# Patient Record
Sex: Female | Born: 1970 | Race: White | Hispanic: No | Smoking: Never smoker
Health system: Southern US, Community
[De-identification: ages and names within clinical notes are randomized; demographics above are authoritative.]

## PROBLEM LIST (undated history)

## (undated) DIAGNOSIS — M810 Age-related osteoporosis without current pathological fracture: Secondary | ICD-10-CM

## (undated) DIAGNOSIS — I499 Cardiac arrhythmia, unspecified: Secondary | ICD-10-CM

## (undated) DIAGNOSIS — K589 Irritable bowel syndrome without diarrhea: Secondary | ICD-10-CM

## (undated) DIAGNOSIS — G43909 Migraine, unspecified, not intractable, without status migrainosus: Secondary | ICD-10-CM

## (undated) DIAGNOSIS — E039 Hypothyroidism, unspecified: Secondary | ICD-10-CM

## (undated) DIAGNOSIS — M1990 Unspecified osteoarthritis, unspecified site: Secondary | ICD-10-CM

## (undated) DIAGNOSIS — T7840XA Allergy, unspecified, initial encounter: Secondary | ICD-10-CM

## (undated) DIAGNOSIS — R413 Other amnesia: Secondary | ICD-10-CM

## (undated) DIAGNOSIS — D332 Benign neoplasm of brain, unspecified: Secondary | ICD-10-CM

## (undated) DIAGNOSIS — I10 Essential (primary) hypertension: Secondary | ICD-10-CM

## (undated) DIAGNOSIS — R011 Cardiac murmur, unspecified: Secondary | ICD-10-CM

## (undated) DIAGNOSIS — K219 Gastro-esophageal reflux disease without esophagitis: Secondary | ICD-10-CM

## (undated) HISTORY — DX: Age-related osteoporosis without current pathological fracture: M81.0

## (undated) HISTORY — DX: Allergy, unspecified, initial encounter: T78.40XA

## (undated) HISTORY — PX: WISDOM TOOTH EXTRACTION: SHX21

## (undated) HISTORY — DX: Irritable bowel syndrome, unspecified: K58.9

## (undated) HISTORY — DX: Gastro-esophageal reflux disease without esophagitis: K21.9

## (undated) HISTORY — DX: Benign neoplasm of brain, unspecified: D33.2

## (undated) HISTORY — PX: LIVER BIOPSY: SHX301

## (undated) HISTORY — DX: Cardiac arrhythmia, unspecified: I49.9

## (undated) HISTORY — DX: Migraine, unspecified, not intractable, without status migrainosus: G43.909

## (undated) HISTORY — DX: Other amnesia: R41.3

## (undated) HISTORY — PX: BRAIN SURGERY: SHX531

## (undated) HISTORY — DX: Hypothyroidism, unspecified: E03.9

## (undated) HISTORY — DX: Essential (primary) hypertension: I10

## (undated) HISTORY — DX: Unspecified osteoarthritis, unspecified site: M19.90

## (undated) HISTORY — DX: Cardiac murmur, unspecified: R01.1

---

## 2002-06-12 ENCOUNTER — Encounter: Payer: Self-pay | Admitting: Endocrinology

## 2002-06-12 ENCOUNTER — Ambulatory Visit (HOSPITAL_COMMUNITY): Admission: RE | Admit: 2002-06-12 | Discharge: 2002-06-12 | Payer: Self-pay | Admitting: Endocrinology

## 2003-08-22 ENCOUNTER — Other Ambulatory Visit: Admission: RE | Admit: 2003-08-22 | Discharge: 2003-08-22 | Payer: Self-pay | Admitting: Endocrinology

## 2004-01-15 HISTORY — PX: OTHER SURGICAL HISTORY: SHX169

## 2004-09-13 ENCOUNTER — Other Ambulatory Visit: Admission: RE | Admit: 2004-09-13 | Discharge: 2004-09-13 | Payer: Self-pay | Admitting: Internal Medicine

## 2004-10-26 ENCOUNTER — Encounter (INDEPENDENT_AMBULATORY_CARE_PROVIDER_SITE_OTHER): Payer: Self-pay | Admitting: Cardiology

## 2004-10-26 ENCOUNTER — Ambulatory Visit (HOSPITAL_COMMUNITY): Admission: RE | Admit: 2004-10-26 | Discharge: 2004-10-26 | Payer: Self-pay | Admitting: Cardiology

## 2004-11-08 ENCOUNTER — Ambulatory Visit: Payer: Self-pay | Admitting: Internal Medicine

## 2004-11-16 ENCOUNTER — Ambulatory Visit (HOSPITAL_COMMUNITY): Admission: RE | Admit: 2004-11-16 | Discharge: 2004-11-16 | Payer: Self-pay | Admitting: Internal Medicine

## 2004-11-16 ENCOUNTER — Ambulatory Visit: Payer: Self-pay | Admitting: Internal Medicine

## 2004-12-08 ENCOUNTER — Emergency Department (HOSPITAL_COMMUNITY): Admission: EM | Admit: 2004-12-08 | Discharge: 2004-12-08 | Payer: Self-pay | Admitting: Emergency Medicine

## 2004-12-18 ENCOUNTER — Ambulatory Visit: Payer: Self-pay | Admitting: Internal Medicine

## 2005-01-31 ENCOUNTER — Other Ambulatory Visit: Admission: RE | Admit: 2005-01-31 | Discharge: 2005-01-31 | Payer: Self-pay | Admitting: Obstetrics and Gynecology

## 2005-07-25 ENCOUNTER — Ambulatory Visit (HOSPITAL_COMMUNITY): Admission: RE | Admit: 2005-07-25 | Discharge: 2005-07-25 | Payer: Self-pay | Admitting: Urology

## 2006-03-12 ENCOUNTER — Encounter: Admission: RE | Admit: 2006-03-12 | Discharge: 2006-03-12 | Payer: Self-pay | Admitting: Allergy and Immunology

## 2007-01-11 ENCOUNTER — Emergency Department (HOSPITAL_COMMUNITY): Admission: EM | Admit: 2007-01-11 | Discharge: 2007-01-11 | Payer: Self-pay | Admitting: Emergency Medicine

## 2008-02-26 ENCOUNTER — Ambulatory Visit (HOSPITAL_COMMUNITY): Admission: RE | Admit: 2008-02-26 | Discharge: 2008-02-26 | Payer: Self-pay | Admitting: Endocrinology

## 2008-11-19 ENCOUNTER — Encounter: Admission: RE | Admit: 2008-11-19 | Discharge: 2008-11-19 | Payer: Self-pay | Admitting: Internal Medicine

## 2009-05-02 ENCOUNTER — Encounter (HOSPITAL_COMMUNITY): Admission: RE | Admit: 2009-05-02 | Discharge: 2009-07-27 | Payer: Self-pay | Admitting: Endocrinology

## 2009-11-02 ENCOUNTER — Encounter: Admission: RE | Admit: 2009-11-02 | Discharge: 2009-11-02 | Payer: Self-pay | Admitting: Internal Medicine

## 2009-11-08 ENCOUNTER — Encounter: Admission: RE | Admit: 2009-11-08 | Discharge: 2009-11-08 | Payer: Self-pay | Admitting: Internal Medicine

## 2009-11-15 ENCOUNTER — Encounter: Admission: RE | Admit: 2009-11-15 | Discharge: 2009-11-15 | Payer: Self-pay | Admitting: Internal Medicine

## 2010-06-01 NOTE — Op Note (Signed)
NAMEHOLLACE, MICHELLI            ACCOUNT NO.:  [id number]   MEDICAL RECORD NO.:  [id number]          PATIENT TYPE:  OIB   LOCATION:  6524                         FACILITY:  [facility]   PHYSICIAN:  Doylene Canning. Ladona Ridgel, M.D.  DATE OF BIRTH:  [birth year]   DATE OF PROCEDURE:  11/16/2004  DATE OF DISCHARGE:                                 OPERATIVE REPORT   PROCEDURE PERFORMED:  Electrophysiologic study and radiofrequency catheter  ablation of AV node reentrant tachycardia.   INTRODUCTION:  The patient is a 40 year old woman with a history of  longstanding tachypalpitations and documented SVT, who despite medical  therapy has been had recurrences of her SVT.  She is now referred for  catheter ablation.   PROCEDURE:  After informed consent was obtained, the patient was taken  diagnostic EP lab in the fasting state.  After the usual preparation and  draping, intravenous fentanyl and midazolam were given for sedation.  A 6-  [place] hexapolar catheter was inserted percutaneously into the right jugular  vein and advanced to the coronary sinus.  A 5-French quadripolar catheter  was inserted percutaneously in the right femoral vein and advanced to the  His bundle region.  A 5-French quadripolar catheter was inserted  percutaneously in the right femoral vein and advanced to the RV apex.  After  measurement of the basic intervals, rapid ventricular pacing was carried out  from the RV apex at a pacing cycle length of 600 msec and stepwise decreased  down to 360 msec, resulting in the induction of SVT.  Pacing from the  coronary sinus quickly terminated the SVT.  At this point additional rapid  ventricular pacing was carried out down to 300 msec, where VA Wenckebach was  observed.  It should be noted that during additional decrements from 360  msec down to 300 msec, SVT was easily induced.  Next programmed ventricular  stimulation was carried out from the RV apex at base drive cycle length of  161 msec.   The S1-S2 interval was stepwise decreased from 500 msec down to  260 msec, where ventricular refractoriness was observed.  Again during  programmed ventricular stimulation there was easily and reproducibly  inducible SVT.  During programmed ventricular stimulation the atrial  activation was midline and decremental.  Next rapid atrial pacing was  carried out from the coronary sinus as well as the high right atrium at base  drive cycle lengths of 096 msec.  The S1-S2 interval was stepwise decreased  from 440 msec down to 360 msec, where additional SVT was initiated.  Additional decrements were carried out down to 270 msec.  At this location  SVT was intermittently initiated.  At an S1-S2 coupling interval of 500/260,  there was atrial refractoriness.  Next rapid atrial pacing was carried out  from the coronary sinus as well as the high right atrium at pacing cycle  lengths of 600 msec and stepwise decreased down to 380 msec, where a  tachycardia was again reproducibly induced.  Additional decrements were then  carried out down to 300 msec, where AV Wenckebach was observed.  During  rapid  atrial pacing the SVT was easily inducible down to a cycle length of  300 msec.  During tachycardia, ventricular pacing was carried out and  demonstrated a VAV conduction sequence and PVCs placed the time of His  bundle refractoriness did not pre-excite the atrium.  It should also be  noted that during tachycardia, mapping demonstrated the earliest atrial  activation to be in the His bundle region.  All the above confirmed the  diagnosis of AV node reentrant tachycardia.  The 7-French quadripolar  ablation catheter was then maneuvered into the right atrium.  Mapping was  carried out in Koch's triangle.  Koch's triangle was of normal size and  orientation.  Three RF energy applications were delivered in Koch's  triangle.  During this time accelerated junctional rhythm was observed.  Following  catheter  ablation, additional rapid atrial and ventricular pacing was  carried out over the course the next 30 minutes and there was no inducible  SVT.  There were residual echo beats.  The catheter was then removed,  hemostasis was assured and the patient returned to her room in satisfactory  condition.   COMPLICATIONS:  There were no immediate procedure complications.   RESULTS:  A.  Baseline ECG:  The baseline ECG demonstrates normal sinus  rhythm with normal axis and intervals.  B.  Baseline intervals:  The sinus node cycle length was 840 msec, the PR  interval 121 msec, the QRS duration 100 msec, the HV interval was 42 msec.  C.  Rapid ventricular pacing:  Rapid atrial pacing was carried out the RV  apex  decremented down to 300 msec, where VA Wenckebach was observed.  During rapid ventricular pacing the atrial activation was midline  decremental and there was easily inducible SVT prior to ablation.  D.  Programmed ventricular stimulation:  Programmed ventricular stimulation  was carried out from the RV apex at a base drive cycle length of 811 msec.  The S1-S2 interval was stepwise decreased from 400 msec down to 260 msec,  where ventricular refractoriness was observed.  During programmed  ventricular stimulation the atrial activation was midline and decremental.  There was reproducibly inducible SVT.  E.  Programmed atrial stimulation:  Programmed atrial stimulation was  carried out from the coronary sinus as well as the high right atrium at base  drive cycle length of 914 msec.  The S1-S2 interval was stepwise decreased  from 440 msec down to 260 msec, where the AV node ERP was observed.  During  programmed atrial stimulation, there were multiple AH jumps and echo beats  and easily inducible SVT.  F.  Rapid atrial pacing:  Rapid atrial pacing was carried out from the  coronary sinus as well as the high right atrium at pacing cycle length of 500 msec and stepwise decreased down to 380  msec, where AV Wenckebach was  observed.  During rapid atrial pacing the PR interval was greater than the  RR interval prior to ablation and less than the RR interval after ablation.  G.  Arrhythmias observed:  AV node reentrant tachycardia.  Initiation:  Initiation was with rapid atrial and ventricular pacing and well as  programmed atrial and ventricular stimulation.  The duration was sustained.  Cycle length was approximately 420 msec.  The method of termination was with  coronary sinus pacing at 300 msec.  H.  Mapping:  Mapping of the patient's Koch's triangle and of the  tachycardia demonstrated the earliest atrial activation to be in the His  bundle region.  Koch's triangle was of normal size and orientation.  1.  RF energy application:  A total of three RF energy applications were      delivered at site 7-9 in Koch's triangle, resulting in accelerated      junctional rhythm.  Following catheter ablation, there was no inducible      SVT.   CONCLUSION:  This study demonstrates successful electrophysiological study  and radiofrequency ablation of AV node reentrant tachycardia with a total of  three radiofrequency energy applications delivered to the slow pathway  region of Koch's triangle, resulting in rendering the tachycardia not  inducible.           ______________________________  Doylene Canning. Ladona Ridgel, M.D.     GWT/MEDQ  D:  11/16/2004  T:  11/16/2004  Job:  [id number]   cc:   Cristy Hilts. Jacinto Halim, MD  Fax: [phone]

## 2010-06-01 NOTE — Discharge Summary (Signed)
Linda Hebert, Linda Hebert            ACCOUNT NO.:  [id number]   MEDICAL RECORD NO.:  [id number]          PATIENT TYPE:  OIB   LOCATION:  6524                         FACILITY:  [facility]   PHYSICIAN:  Doylene Canning. Ladona Ridgel, M.D.  DATE OF BIRTH:  [birth year]   DATE OF ADMISSION:  11/16/2004  DATE OF DISCHARGE:  11/16/2004                                 DISCHARGE SUMMARY   ALLERGIES:  CODEINE, DARVOCET, and NARCOTICS.   DISCHARGE DIAGNOSES:  1.  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 This was      a slow P-wave modification by Dr. Lewayne Bunting.  2.  History of palpitation/narrow complex tachycardia by      electrocardiogram/anxiety. The patient has minimal chest pain and      minimal dyspnea with her symptoms. These symptoms have become persistent      and more frequent despite beta blockade.  3.  Migraines.  4.  Hypothyroidism.  5.  History of benign central neurocytoma diagnosed in 1994.   PROCEDURE:  On November 16, 2004, electrophysiology study with radiofrequency  catheter ablation of atypical AV nodal re-entrant tachycardia with  successful slow P-wave modification. After radiofrequency catheter ablation  delivered, no recurrent of SVT.   DISCHARGE DISPOSITION:  Linda Hebert, discharging day of procedure, she has  remained afebrile, she has maintained sinus rhythm, she has had no  recurrence of tachypalpitations. Catheterization sites in the right groin  and right neck are without swelling, pain, erythema, or drainage. The  patient will not have to take atenolol any more. She is able to shower. She  is to call [phone] if she experiences pain or swelling at the cath sites.  She is asked not to lift anything heavier than 10 pounds for the next week,  no driving for the next two days. For pain management, Tylenol 325 mg one to  two tabs q.4-6h.   MEDICATIONS AT DISCHARGE:  1.  Enteric-coated  aspirin 325 mg daily for the next six weeks.  2.  Levothyroxine 88 mcg daily.  3.  Bactrim DS one tablet daily.  4.  Topamax 75 mg daily.  5.  Calcium 500 mg daily.  6.  Imitrex 50 mg p.r.n. for migraine  headaches.  7.  Multivitamin daily.  8.  Vitamin D 800 international units daily.  9.  Yasmin one tablet daily.   Of note if the patient plans dental work even just teeth cleaning through  April 2007 she is to call [phone] for antibiotic coverage. She is to see  Dr. Ladona Ridgel in four to six weeks. Dr. Lubertha Basque office will call for that  appointment.   BRIEF HISTORY:  Linda Hebert is a 40 year old female with a long history of  tachypalpitations. She has been on atenolol for at least seven years. These  episodes have never been captured on EKG until she was seen by Dr.  Nicholos Johns. At that time the palpitations were sustained. A 12-lead  echocardiogram done in Dr. Carolyn Stare office demonstrated narrow QRS  tachycardia 140-150 beats per minute. The dysrhythmia  was attenuated by  additional atenolol and Toprol. The EKG study is consistent with a short RP  tachycardia. The differential diagnoses are AV nodal re-entrant versus AV re-  entrant versus other accessory pathway from the atria to the ventricle. The  patient has never had a history of syncope. She denies chest tightness and  shortness of breath with her tachypalpitations. They do make her feel  anxious and she is fatigued at the time they resolve. The patient is having  recurrent supraventricular tachycardia despite medical therapy. The risks  and benefits of electrophysiology study and radiofrequency catheter ablation  have been explained and the patient is willing to proceed.   HOSPITAL COURSE:  The patient presented on November 3rd. She underwent  successful radiofrequency catheter ablation of atypical AV nodal re-entrant  tachycardia. She has had no recurrence during hospital monitoring. She was  discharging  approximately 10 hours after the procedure. She goes home with  the medications prior to this admission except for atenolol which she can  discontinue. She will be given a prescription for antibiotic prophylaxis and  she plans dental work with orthodontia removal in early December.      Maple Mirza, P.A.    ______________________________  Doylene Canning. Ladona Ridgel, M.D.    GM/MEDQ  D:  11/16/2004  T:  11/17/2004  Job:  [id number]   cc:   Cristy Hilts. Jacinto Halim, MD  Fax: [phone]   Doylene Canning. Ladona Ridgel, M.D.  [address]  [address]  [place]  [place] [address]   Georgianne Fick, M.D.  Fax: [phone]

## 2010-06-26 ENCOUNTER — Other Ambulatory Visit: Payer: Self-pay | Admitting: Internal Medicine

## 2010-06-26 DIAGNOSIS — R160 Hepatomegaly, not elsewhere classified: Secondary | ICD-10-CM

## 2010-07-05 ENCOUNTER — Ambulatory Visit
Admission: RE | Admit: 2010-07-05 | Discharge: 2010-07-05 | Disposition: A | Discharge status: Home / Self Care | Payer: BC Managed Care – PPO | Source: Ambulatory Visit | Attending: Internal Medicine | Admitting: Internal Medicine

## 2010-07-05 DIAGNOSIS — R160 Hepatomegaly, not elsewhere classified: Secondary | ICD-10-CM

## 2010-07-05 MED ORDER — GADOXETATE DISODIUM 0.25 MMOL/ML IV SOLN
8.0000 mL | Freq: Once | INTRAVENOUS | Status: AC | PRN
  Administered 2010-07-05: 8 mL via INTRAVENOUS

## 2010-07-11 ENCOUNTER — Other Ambulatory Visit (HOSPITAL_COMMUNITY): Payer: Self-pay | Admitting: Internal Medicine

## 2010-07-11 DIAGNOSIS — R160 Hepatomegaly, not elsewhere classified: Secondary | ICD-10-CM

## 2010-07-20 ENCOUNTER — Other Ambulatory Visit: Payer: Self-pay | Admitting: Internal Medicine

## 2010-07-20 ENCOUNTER — Ambulatory Visit (HOSPITAL_COMMUNITY): Payer: BC Managed Care – PPO

## 2010-07-20 ENCOUNTER — Other Ambulatory Visit: Payer: Self-pay | Admitting: Diagnostic Radiology

## 2010-07-20 ENCOUNTER — Ambulatory Visit (HOSPITAL_COMMUNITY)
Admission: RE | Admit: 2010-07-20 | Discharge: 2010-07-20 | Disposition: A | Discharge status: Home / Self Care | Payer: BC Managed Care – PPO | Source: Ambulatory Visit | Attending: Internal Medicine | Admitting: Internal Medicine

## 2010-07-20 DIAGNOSIS — Z01812 Encounter for preprocedural laboratory examination: Secondary | ICD-10-CM | POA: Insufficient documentation

## 2010-07-20 DIAGNOSIS — R160 Hepatomegaly, not elsewhere classified: Secondary | ICD-10-CM

## 2010-07-20 DIAGNOSIS — K7689 Other specified diseases of liver: Secondary | ICD-10-CM | POA: Insufficient documentation

## 2010-07-20 LAB — CBC
HCT: 39.4 % (ref 36.0–46.0)
Hemoglobin: 13 g/dL (ref 12.0–15.0)
MCHC: 33 g/dL (ref 30.0–36.0)
MCV: 88.7 fL (ref 78.0–100.0)
Platelets: 351 10*3/uL (ref 150–400)
RBC: 4.44 MIL/uL (ref 3.87–5.11)
RDW: 15.1 % (ref 11.5–15.5)
WBC: 14.3 10*3/uL — ABNORMAL HIGH (ref 4.0–10.5)

## 2010-07-20 LAB — APTT: aPTT: 29 seconds (ref 24–37)

## 2010-07-20 LAB — PROTIME-INR
INR: 0.98 (ref 0.00–1.49)
Prothrombin Time: 13.2 seconds (ref 11.6–15.2)

## 2010-10-19 LAB — DIFFERENTIAL
Basophils Absolute: 0
Basophils Relative: 0
Eosinophils Absolute: 0.2
Eosinophils Relative: 2
Lymphocytes Relative: 26
Lymphs Abs: 3.2
Monocytes Absolute: 0.5
Monocytes Relative: 4
Neutro Abs: 8.4 — ABNORMAL HIGH
Neutrophils Relative %: 68

## 2010-10-19 LAB — POCT I-STAT CREATININE
Creatinine, Ser: 1.1
Operator id: 285491

## 2010-10-19 LAB — I-STAT 8, (EC8 V) (CONVERTED LAB)
Acid-base deficit: 1
BUN: 11
Bicarbonate: 25 — ABNORMAL HIGH
Chloride: 106
Glucose, Bld: 89
HCT: 45
Hemoglobin: 15.3 — ABNORMAL HIGH
Operator id: 285491
Potassium: 4.1
Sodium: 137
TCO2: 26
pCO2, Ven: 46.5
pH, Ven: 7.339 — ABNORMAL HIGH

## 2010-10-19 LAB — CBC
HCT: 41.4
Hemoglobin: 13.9
MCHC: 33.4
MCV: 93
Platelets: 319
RBC: 4.46
RDW: 13.4
WBC: 12.3 — ABNORMAL HIGH

## 2010-10-19 LAB — POCT CARDIAC MARKERS
CKMB, poc: <1 — ABNORMAL LOW
Myoglobin, poc: 61.2
Operator id: 285491
Troponin i, poc: <0.05

## 2010-11-22 ENCOUNTER — Other Ambulatory Visit: Payer: Self-pay | Admitting: Gastroenterology

## 2010-11-22 DIAGNOSIS — K769 Liver disease, unspecified: Secondary | ICD-10-CM

## 2010-11-30 ENCOUNTER — Other Ambulatory Visit (HOSPITAL_COMMUNITY): Payer: Self-pay | Admitting: *Deleted

## 2010-12-03 ENCOUNTER — Ambulatory Visit (HOSPITAL_COMMUNITY)
Admission: RE | Admit: 2010-12-03 | Discharge: 2010-12-03 | Disposition: A | Discharge status: Home / Self Care | Payer: BC Managed Care – PPO | Source: Ambulatory Visit | Attending: Endocrinology | Admitting: Endocrinology

## 2010-12-03 DIAGNOSIS — M810 Age-related osteoporosis without current pathological fracture: Secondary | ICD-10-CM | POA: Insufficient documentation

## 2010-12-03 MED ORDER — ZOLEDRONIC ACID 5 MG/100ML IV SOLN
5.0000 mg | INTRAVENOUS | Status: AC
  Administered 2010-12-03: 5 mg via INTRAVENOUS
  Filled 2010-12-03: qty 100

## 2010-12-17 ENCOUNTER — Ambulatory Visit
Admission: RE | Admit: 2010-12-17 | Discharge: 2010-12-17 | Disposition: A | Discharge status: Home / Self Care | Payer: BC Managed Care – PPO | Source: Ambulatory Visit | Attending: Gastroenterology | Admitting: Gastroenterology

## 2010-12-17 DIAGNOSIS — K769 Liver disease, unspecified: Secondary | ICD-10-CM

## 2011-06-03 ENCOUNTER — Encounter: Payer: BC Managed Care – PPO | Attending: Endocrinology | Admitting: *Deleted

## 2011-06-03 DIAGNOSIS — Z713 Dietary counseling and surveillance: Secondary | ICD-10-CM | POA: Insufficient documentation

## 2011-06-03 DIAGNOSIS — E119 Type 2 diabetes mellitus without complications: Secondary | ICD-10-CM | POA: Insufficient documentation

## 2011-06-06 ENCOUNTER — Encounter: Payer: Self-pay | Admitting: *Deleted

## 2011-06-06 NOTE — Patient Instructions (Signed)

## 2011-06-06 NOTE — Progress Notes (Signed)
  Patient was seen on 06/03/2011 for the first of a series of three diabetes self-management courses at the [facility]. Current A1c is 6.2%  The following learning objectives were met by the patient during this course:   Defines the role of glucose and insulin  Identifies type of diabetes and pathophysiology  Defines the diagnostic criteria for diabetes and prediabetes  States the risk factors for Type 2 Diabetes  States the symptoms of Type 2 Diabetes  Defines Type 2 Diabetes treatment goals  Defines Type 2 Diabetes treatment options  States the rationale for glucose monitoring  Identifies A1C, glucose targets, and testing times  Identifies proper sharps disposal  Defines the purpose of a diabetes food plan  Identifies carbohydrate food groups  Defines effects of carbohydrate foods on glucose levels  Identifies carbohydrate choices/grams/food labels  States benefits of physical activity and effect on glucose  Review of suggested activity guidelines  Handouts given during class include:  Type 2 Diabetes: Basics Book  My Food Plan Book  Food and Activity Log  Follow-Up Plan: Patient plans to attend Core Class 2 within one month

## 2011-06-25 ENCOUNTER — Encounter: Payer: BC Managed Care – PPO | Attending: Endocrinology | Admitting: Dietician

## 2011-06-25 DIAGNOSIS — E119 Type 2 diabetes mellitus without complications: Secondary | ICD-10-CM | POA: Insufficient documentation

## 2011-06-25 DIAGNOSIS — Z713 Dietary counseling and surveillance: Secondary | ICD-10-CM | POA: Insufficient documentation

## 2011-06-26 NOTE — Progress Notes (Signed)
  Patient was seen on 06/25/2011 for the second of a series of three diabetes self-management courses at the [facility]. The following learning objectives were met by the patient during this course:   Explain basic nutrition maintenance and quality assurance  Describe causes, symptoms and treatment of hypoglycemia and hyperglycemia  Explain how to manage diabetes during illness  Describe the importance of good nutrition for health and healthy eating strategies  List strategies to follow meal plan when dining out  Describe the effects of alcohol on glucose and how to use it safely  Describe problem solving skills for day-to-day glucose challenges  Describe strategies to use when treatment plan needs to change  Identify important factors involved in successful weight loss  Describe ways to remain physically active  Describe the impact of regular activity on insulin resistance   Handouts given in class:  Refrigerator magnet for Sick Day Guidelines  [facility] Oral medication/insulin handout  Follow-Up Plan: Patient will attend the final class of the ADA Diabetes Self-Care Education.   

## 2011-07-02 ENCOUNTER — Ambulatory Visit: Payer: BC Managed Care – PPO

## 2012-06-16 ENCOUNTER — Other Ambulatory Visit: Payer: Self-pay | Admitting: Gastroenterology

## 2012-06-16 DIAGNOSIS — R7989 Other specified abnormal findings of blood chemistry: Secondary | ICD-10-CM

## 2012-06-22 ENCOUNTER — Other Ambulatory Visit: Payer: Self-pay | Admitting: Obstetrics and Gynecology

## 2012-06-24 ENCOUNTER — Other Ambulatory Visit: Payer: BC Managed Care – PPO

## 2012-07-01 ENCOUNTER — Ambulatory Visit
Admission: RE | Admit: 2012-07-01 | Discharge: 2012-07-01 | Disposition: A | Discharge status: Home / Self Care | Payer: 59 | Source: Ambulatory Visit | Attending: Gastroenterology | Admitting: Gastroenterology

## 2012-07-01 DIAGNOSIS — R7989 Other specified abnormal findings of blood chemistry: Secondary | ICD-10-CM

## 2012-09-22 ENCOUNTER — Other Ambulatory Visit: Payer: Self-pay | Admitting: Podiatry

## 2012-09-22 ENCOUNTER — Ambulatory Visit
Admission: RE | Admit: 2012-09-22 | Discharge: 2012-09-22 | Disposition: A | Discharge status: Home / Self Care | Payer: 59 | Source: Ambulatory Visit | Attending: Podiatry | Admitting: Podiatry

## 2012-09-22 DIAGNOSIS — M25572 Pain in left ankle and joints of left foot: Secondary | ICD-10-CM

## 2012-09-22 MED ORDER — GADOBENATE DIMEGLUMINE 529 MG/ML IV SOLN
9.0000 mL | Freq: Once | INTRAVENOUS | Status: AC | PRN
  Administered 2012-09-22: 9 mL via INTRAVENOUS

## 2013-06-05 ENCOUNTER — Ambulatory Visit (INDEPENDENT_AMBULATORY_CARE_PROVIDER_SITE_OTHER): Payer: 59 | Admitting: Emergency Medicine

## 2013-06-05 VITALS — BP 124/90 | HR 64 | Temp 97.8°F | Ht 65.0 in | Wt 190.8 lb

## 2013-06-05 DIAGNOSIS — B9689 Other specified bacterial agents as the cause of diseases classified elsewhere: Secondary | ICD-10-CM

## 2013-06-05 DIAGNOSIS — A499 Bacterial infection, unspecified: Secondary | ICD-10-CM

## 2013-06-05 DIAGNOSIS — N760 Acute vaginitis: Secondary | ICD-10-CM

## 2013-06-05 DIAGNOSIS — S335XXA Sprain of ligaments of lumbar spine, initial encounter: Secondary | ICD-10-CM

## 2013-06-05 LAB — POCT URINALYSIS DIPSTICK
BILIRUBIN UA: NEGATIVE
GLUCOSE UA: NEGATIVE
Ketones, UA: NEGATIVE
NITRITE UA: NEGATIVE
Protein, UA: NEGATIVE
Spec Grav, UA: <=1.005
UROBILINOGEN UA: 0.2
pH, UA: 5.5

## 2013-06-05 LAB — POCT WET PREP WITH KOH
KOH PREP POC: NEGATIVE
TRICHOMONAS UA: NEGATIVE
Yeast Wet Prep HPF POC: NEGATIVE

## 2013-06-05 LAB — POCT UA - MICROSCOPIC ONLY
CASTS, UR, LPF, POC: NEGATIVE
Crystals, Ur, HPF, POC: NEGATIVE
MUCUS UA: NEGATIVE
YEAST UA: NEGATIVE

## 2013-06-05 MED ORDER — TRAMADOL HCL 50 MG PO TABS: 50.0000 mg | ORAL_TABLET | Freq: Three times a day (TID) | ORAL | Status: DC | PRN

## 2013-06-05 MED ORDER — METRONIDAZOLE 500 MG PO TABS: 500.0000 mg | ORAL_TABLET | Freq: Two times a day (BID) | ORAL | Status: DC

## 2013-06-05 MED ORDER — NAPROXEN SODIUM 550 MG PO TABS: 550.0000 mg | ORAL_TABLET | Freq: Two times a day (BID) | ORAL | Status: DC

## 2013-06-05 MED ORDER — CYCLOBENZAPRINE HCL 10 MG PO TABS: 10.0000 mg | ORAL_TABLET | Freq: Three times a day (TID) | ORAL | Status: DC | PRN

## 2013-06-05 NOTE — Patient Instructions (Signed)

## 2013-06-05 NOTE — Progress Notes (Signed)
[facility] [address] [place] [address] [phone]  Date:  06/05/2013   Name:  Linda Hebert   DOB:  02-22-1970   MRN:  [id number]  PCP:  Merrilee Seashore, MD    Chief Complaint: Back Pain   History of Present Illness:  Linda Hebert is a 43 y.o. very pleasant female patient who presents with the following:  Multiple complaints.  Had diarrhea all week. Under treatment by [facility].  The patient has no complaint of blood, mucous, or pus in her stools.  No fever or chills. No nausea or vomiting.  No diarrhea since yesterday morning.  No abdominal pain. Thinks she may have a vaginal yeast infection.  No dysuria or discharge. Yesterday developed low back pain into the buttocks bilaterally no overuse or injury.  There are no active problems to display for this patient.   Past Medical History  Diagnosis Date  . IBS (irritable bowel syndrome)   . Diabetes mellitus     Past Surgical History  Procedure Laterality Date  . Brain surgery    . Heart ablation  2006    History  Substance Use Topics  . Smoking status: Unknown If Ever Smoked  . Smokeless tobacco: Not on file  . Alcohol Use: Yes     Comment: occasional    Family History  Problem Relation Age of Onset  . Cancer Other   . Hypertension Other     Allergies  Allergen Reactions  . Bactrim Nausea And Vomiting  . Codeine Nausea Only    Medication list has been reviewed and updated.  Current Outpatient Prescriptions on File Prior to Visit  Medication Sig Dispense Refill  . calcium carbonate (TUMS - DOSED IN MG ELEMENTAL CALCIUM) 500 MG chewable tablet Chew 1 tablet by mouth daily.      . cetirizine (ZYRTEC) 10 MG tablet Take 10 mg by mouth daily.      . famotidine (PEPCID AC) 10 MG chewable tablet Chew 10 mg by mouth 2 (two) times daily as needed.      Marland Kitchen levothyroxine (SYNTHROID, LEVOTHROID) 25 MCG tablet Take 25 mcg by mouth daily.      Marland Kitchen LORazepam (ATIVAN) 0.5 MG tablet Take  0.5 mg by mouth every 6 (six) hours as needed.      Marland Kitchen METOPROLOL SUCCINATE ER PO Take 25 mg by mouth daily.      . mometasone (NASONEX) 50 MCG/ACT nasal spray Place 2 sprays into the nose as needed.      . Multiple Vitamins-Minerals (ADEKS) chewable tablet Chew 2 tablets by mouth daily.      . Omeprazole 20 MG TBEC Take by mouth daily before breakfast.      . SUMAtriptan (IMITREX) 100 MG tablet Take 100 mg by mouth 2 (two) times daily as needed.      . vitamin C (ASCORBIC ACID) 500 MG tablet Take 500 mg by mouth daily.      . vitamin E 400 UNIT capsule Take 400 Units by mouth daily.      Marland Kitchen zonisamide (ZONEGRAN) 100 MG capsule Take 100 mg by mouth 2 (two) times daily.       No current facility-administered medications on file prior to visit.    Review of Systems:  As per HPI, otherwise negative.  Marland Kitchen  Physical Examination: Filed Vitals:   06/05/13 1201  BP: 124/90  Pulse: 64  Temp: 97.8 F (36.6 C)   Filed Vitals:   06/05/13 1201  Height: 5\' 5"  (1.651 m)  Weight: 190 lb 12.8 oz (86.546 kg)   Body mass index is 31.75 kg/(m^2). Ideal Body Weight: Weight in (lb) to have BMI = 25: 149.9  GEN: WDWN, NAD, Non-toxic, A & O x 3 HEENT: Atraumatic, Normocephalic. Neck supple. No masses, No LAD. Ears and Nose: No external deformity. CV: RRR, No M/G/R. No JVD. No thrill. No extra heart sounds. PULM: CTA B, no wheezes, crackles, rhonchi. No retractions. No resp. distress. No accessory muscle use. ABD: S, NT, ND, +BS. No rebound. No HSM. EXTR: No c/c/e NEURO Normal gait.  PSYCH: Normally interactive. Conversant. Not depressed or anxious appearing.  Calm demeanor.  BACK:  Tender lumbosacral musculature  Assessment and Plan: Lumbar strain BV Anaprox Flexeril tyl #3  Signed,  Ellison Carwin, MD   Results for orders placed in visit on 06/05/13  POCT WET PREP WITH KOH      Result Value Ref Range   Trichomonas, UA Negative     Clue Cells Wet Prep HPF POC 0-2     Epithelial Wet  Prep HPF POC 3-5     Yeast Wet Prep HPF POC neg     Bacteria Wet Prep HPF POC 1+     RBC Wet Prep HPF POC 0-2     WBC Wet Prep HPF POC 25-30     KOH Prep POC Negative    POCT UA - MICROSCOPIC ONLY      Result Value Ref Range   WBC, Ur, HPF, POC 2-4     RBC, urine, microscopic 1-3     Bacteria, U Microscopic trace     Mucus, UA neg     Epithelial cells, urine per micros 1-3     Crystals, Ur, HPF, POC neg     Casts, Ur, LPF, POC neg     Yeast, UA neg    POCT URINALYSIS DIPSTICK      Result Value Ref Range   Color, UA yellow     Clarity, UA clear     Glucose, UA neg     Bilirubin, UA neg     Ketones, UA neg     Spec Grav, UA <=1.005     Blood, UA trace     pH, UA 5.5     Protein, UA neg     Urobilinogen, UA 0.2     Nitrite, UA neg     Leukocytes, UA small (1+)

## 2013-06-06 ENCOUNTER — Telehealth: Payer: Self-pay

## 2013-06-06 NOTE — Telephone Encounter (Signed)
Medication given yesterday is  Severe Drowsiness and dizziness. Patient says she passed out asleep that she slept through dinner and didn't eat. Please call immediately she needs to eat breakfast and is afraid to take the medication before work.    #: [phone]

## 2013-06-06 NOTE — Telephone Encounter (Signed)
Advised patient to discontinue the Tramadol and Flexeril and only use the antibiotics, and Naproxen (with food). She is advised she may take one half of the Tramadol if needed today, and this evening to try one half a tablet of the Flexeril.

## 2013-06-07 ENCOUNTER — Ambulatory Visit: Payer: 59

## 2013-06-07 ENCOUNTER — Ambulatory Visit (INDEPENDENT_AMBULATORY_CARE_PROVIDER_SITE_OTHER): Payer: 59 | Admitting: Family Medicine

## 2013-06-07 VITALS — BP 120/82 | HR 66 | Temp 97.3°F | Resp 18 | Wt 191.0 lb

## 2013-06-07 DIAGNOSIS — M549 Dorsalgia, unspecified: Secondary | ICD-10-CM

## 2013-06-07 DIAGNOSIS — E119 Type 2 diabetes mellitus without complications: Secondary | ICD-10-CM

## 2013-06-07 DIAGNOSIS — R197 Diarrhea, unspecified: Secondary | ICD-10-CM

## 2013-06-07 DIAGNOSIS — M7062 Trochanteric bursitis, left hip: Secondary | ICD-10-CM

## 2013-06-07 DIAGNOSIS — M76899 Other specified enthesopathies of unspecified lower limb, excluding foot: Secondary | ICD-10-CM

## 2013-06-07 DIAGNOSIS — M7061 Trochanteric bursitis, right hip: Secondary | ICD-10-CM

## 2013-06-07 DIAGNOSIS — R1013 Epigastric pain: Secondary | ICD-10-CM

## 2013-06-07 LAB — COMPREHENSIVE METABOLIC PANEL
ALT: 19 U/L (ref 0–35)
AST: 17 U/L (ref 0–37)
Albumin: 4.8 g/dL (ref 3.5–5.2)
Alkaline Phosphatase: 67 U/L (ref 39–117)
BILIRUBIN TOTAL: 0.3 mg/dL (ref 0.2–1.2)
BUN: 11 mg/dL (ref 6–23)
CALCIUM: 9.5 mg/dL (ref 8.4–10.5)
CHLORIDE: 103 meq/L (ref 96–112)
CO2: 25 meq/L (ref 19–32)
CREATININE: 0.86 mg/dL (ref 0.50–1.10)
Glucose, Bld: 111 mg/dL — ABNORMAL HIGH (ref 70–99)
Potassium: 4 mEq/L (ref 3.5–5.3)
Sodium: 137 mEq/L (ref 135–145)
Total Protein: 7.4 g/dL (ref 6.0–8.3)

## 2013-06-07 LAB — POCT URINALYSIS DIPSTICK
Bilirubin, UA: NEGATIVE
Blood, UA: NEGATIVE
GLUCOSE UA: NEGATIVE
KETONES UA: NEGATIVE
Nitrite, UA: NEGATIVE
PROTEIN UA: NEGATIVE
SPEC GRAV UA: 1.02
Urobilinogen, UA: 0.2
pH, UA: 6.5

## 2013-06-07 LAB — POCT UA - MICROSCOPIC ONLY
Casts, Ur, LPF, POC: NEGATIVE
Crystals, Ur, HPF, POC: NEGATIVE
Mucus, UA: NEGATIVE
RBC, URINE, MICROSCOPIC: NEGATIVE
Yeast, UA: NEGATIVE

## 2013-06-07 LAB — POCT WET PREP WITH KOH
CLUE CELLS WET PREP PER HPF POC: NEGATIVE
KOH Prep POC: NEGATIVE
TRICHOMONAS UA: NEGATIVE

## 2013-06-07 LAB — IFOBT (OCCULT BLOOD): IFOBT: NEGATIVE

## 2013-06-07 LAB — POCT CBC
Granulocyte percent: 62.5 %G (ref 37–80)
HCT, POC: 41.6 % (ref 37.7–47.9)
HEMOGLOBIN: 13.4 g/dL (ref 12.2–16.2)
LYMPH, POC: 2.6 (ref 0.6–3.4)
MCH: 30.4 pg (ref 27–31.2)
MCHC: 32.2 g/dL (ref 31.8–35.4)
MCV: 94.4 fL (ref 80–97)
MID (CBC): 0.5 (ref 0–0.9)
MPV: 8.7 fL (ref 0–99.8)
PLATELET COUNT, POC: 350 10*3/uL (ref 142–424)
POC Granulocyte: 5.1 (ref 2–6.9)
POC LYMPH PERCENT: 31.7 %L (ref 10–50)
POC MID %: 5.8 % (ref 0–12)
RBC: 4.41 M/uL (ref 4.04–5.48)
RDW, POC: 13.6 %
WBC: 8.1 10*3/uL (ref 4.6–10.2)

## 2013-06-07 LAB — LIPASE: LIPASE: 97 U/L — AB (ref 0–75)

## 2013-06-07 LAB — POCT SEDIMENTATION RATE: POCT SED RATE: 32 mm/hr — AB (ref 0–22)

## 2013-06-07 LAB — POCT URINE PREGNANCY: Preg Test, Ur: NEGATIVE

## 2013-06-07 MED ORDER — MELOXICAM 15 MG PO TABS: 15.0000 mg | ORAL_TABLET | Freq: Every day | ORAL | Status: DC

## 2013-06-07 MED ORDER — METHOCARBAMOL 500 MG PO TABS: 500.0000 mg | ORAL_TABLET | Freq: Four times a day (QID) | ORAL | Status: DC

## 2013-06-07 NOTE — Patient Instructions (Addendum)
Continue with heat 20 minutes four times a day to low back followed by GENTLE stretching. Take the meloxicam every morning (start today) and use the methocarbamol (robaxin) as needed for muscle spasms - should not cause as much fatigue as the other muscle relaxant so hopefully you can tolerate this while you work. Take a meloxicam when you get home and then wait 2 hrs to see how you do and THEN try methocarbamol just to make sure you don't get to sleepy on it and that you will tolerate it tomorrow at work. If this is not helping, we may need to consider a course of steroids, a steroid injection into your hip bursa, or physical therapy.  Trochanteric Bursitis You have hip pain due to trochanteric bursitis. Bursitis means that the sack near the outside of the hip is filled with fluid and inflamed. This sack is made up of protective soft tissue. The pain from trochanteric bursitis can be severe and keep you from sleep. It can radiate to the buttocks or down the outside of the thigh to the knee. The pain is almost always worse when rising from the seated or lying position and with walking. Pain can improve after you take a few steps. It happens more often in people with hip joint and lumbar spine problems, such as arthritis or previous surgery. Very rarely the trochanteric bursa can become infected, and antibiotics and/or surgery may be needed. Treatment often includes an injection of local anesthetic mixed with cortisone medicine. This medicine is injected into the area where it is most tender over the hip. Repeat injections may be necessary if the response to treatment is slow. You can apply ice packs over the tender area for 30 minutes every 2 hours for the next few days. Anti-inflammatory and/or narcotic pain medicine may also be helpful. Limit your activity for the next few days if the pain continues. See your caregiver in 5-10 days if you are not greatly improved.  SEEK IMMEDIATE MEDICAL CARE IF:  You  develop severe pain, fever, or increased redness.  You have pain that radiates below the knee. EXERCISES STRETCHING EXERCISES - Trochantic Bursitis  These exercises may help you when beginning to rehabilitate your injury. Your symptoms may resolve with or without further involvement from your physician, physical therapist or athletic trainer. While completing these exercises, remember:   Restoring tissue flexibility helps normal motion to return to the joints. This allows healthier, less painful movement and activity.  An effective stretch should be held for at least 30 seconds.  A stretch should never be painful. You should only feel a gentle lengthening or release in the stretched tissue. STRETCH  Iliotibial Band  On the floor or bed, lie on your side so your injured leg is on top. Bend your knee and grab your ankle.  Slowly bring your knee back so that your thigh is in line with your trunk. Keep your heel at your buttocks and gently arch your back so your head, shoulders and hips line up.  Slowly lower your leg so that your knee approaches the floor/bed until you feel a gentle stretch on the outside of your thigh. If you do not feel a stretch and your knee will not fall farther, place the heel of your opposite foot on top of your knee and pull your thigh down farther.  Hold this stretch for __________ seconds.  Repeat __________ times. Complete this exercise __________ times per day. STRETCH Hamstrings, Supine   Lie on your  back. Loop a belt or towel over the ball of your foot as shown.  Straighten your knee and slowly pull on the belt to raise your injured leg. Do not allow the knee to bend. Keep your opposite leg flat on the floor.  Raise the leg until you feel a gentle stretch behind your knee or thigh. Hold this position for __________ seconds.  Repeat __________ times. Complete this stretch __________ times per day. STRETCH - Quadriceps, Prone   Lie on your stomach on a firm  surface, such as a bed or padded floor.  Bend your knee and grasp your ankle. If you are unable to reach, your ankle or pant leg, use a belt around your foot to lengthen your reach.  Gently pull your heel toward your buttocks. Your knee should not slide out to the side. You should feel a stretch in the front of your thigh and/or knee.  Hold this position for __________ seconds.  Repeat __________ times. Complete this stretch __________ times per day. STRETCHING - Hip Flexors, Lunge Half kneel with your knee on the floor and your opposite knee bent and directly over your ankle.  Keep good posture with your head over your shoulders. Tighten your buttocks to point your tailbone downward; this will prevent your back from arching too much.  You should feel a gentle stretch in the front of your thigh and/or hip. If you do not feel any resistance, slightly slide your opposite foot forward and then slowly lunge forward so your knee once again lines up over your ankle. Be sure your tailbone remains pointed downward.  Hold this stretch for __________ seconds.  Repeat __________ times. Complete this stretch __________ times per day. STRETCH - Adductors, Lunge  While standing, spread your legs  Lean away from your injured leg by bending your opposite knee. You may rest your hands on your thigh for balance.  You should feel a stretch in your inner thigh. Hold for __________ seconds.  Repeat __________ times. Complete this exercise __________ times per day. Document Released: 02/08/2004 Document Revised: 03/25/2011 Document Reviewed: 04/14/2008 [facility] Patient Information 2014 [place], [place].

## 2013-06-07 NOTE — Progress Notes (Addendum)
Subjective:  This chart was scribed for Laurey Arrow. Brigitte Pulse, MD   by Stacy Gardner, [facility] Scribe. The patient was seen in room and the patient's care was started at 2:09 PM.  Patient ID: Linda Hebert, female    DOB: 18-Nov-1970, 43 y.o.   MRN: [id number]   Chief Complaint  Patient presents with  . Back Pain    pain moving down legs    Back Pain Associated symptoms include abdominal pain. Pertinent negatives include no fever.   HPI Comments: Linda Hebert is a 43 y.o. female who arrives to the [facility] complaining of persistent lower back pain that remains unchanged, onset one week ago. Pt was seen previously by Dr. Ouida Sills for a lumbar sprain. Pt was treated with flexeril, tramadol, and anaprox. Pt was also treated for BV and given flagyl. This was started after one week of diarrhea. Pt called yesterday because of severe sedation and dizziness from medication.  She took the cyclobenzaprine and tramadol and it caused her sleep most of the day.  Pt was advised to use a half tab of flexeril or tramadol at a time.  Pt is tearful and reports that she "just want to feel better". She has pain that radiates from her hip.  The back pain is the same and has not resolved. She reports the pain radiates from her umbilicus down her lateral upper legs and to her knees. The pain is intermittent and worse with sitting or lying down. The pain is mostly spontaneous. When she rides in cars she has severe pain. She is unable to work due to pain. Denies heart burn or indigestion.   Pt also complains of severe allergies. She feels nauseated. She had of diarrhea five days ago which resolved three days ago. She denies having a BM in the past three days . Pt did not try anything for the diarrhea. She was eating cream of wheat but she started to gag.  Pt reports having a hx of IBS, diarrhea predominant. The episode are not persistent or continual. Her IBS is only induced  by certain foods and she hasn't eaten any of these foods so she feels sure that her diarrhea is not from her IBS and feels distinctly different.    There are no active problems to display for this patient.  Past Medical History  Diagnosis Date  . IBS (irritable bowel syndrome)   . Diabetes mellitus    Past Surgical History  Procedure Laterality Date  . Brain surgery    . Heart ablation  2006   Allergies  Allergen Reactions  . Bactrim Nausea And Vomiting  . Codeine Nausea Only   Prior to Admission medications   Medication Sig Start Date End Date Taking? Authorizing Provider  calcium carbonate (TUMS - DOSED IN MG ELEMENTAL CALCIUM) 500 MG chewable tablet Chew 1 tablet by mouth daily.   Yes Historical Provider, MD  cetirizine (ZYRTEC) 10 MG tablet Take 10 mg by mouth daily.   Yes Historical Provider, MD  famotidine (PEPCID AC) 10 MG chewable tablet Chew 10 mg by mouth 2 (two) times daily as needed.   Yes Historical Provider, MD  levothyroxine (SYNTHROID, LEVOTHROID) 25 MCG tablet Take 25 mcg by mouth daily.   Yes Historical Provider, MD  metroNIDAZOLE (FLAGYL) 500 MG tablet Take 1 tablet (500 mg total) by mouth 2 (two) times daily with a meal. DO NOT CONSUME ALCOHOL WHILE TAKING THIS MEDICATION. 06/05/13  Yes  Ellison Carwin, MD  mometasone (NASONEX) 50 MCG/ACT nasal spray Place 2 sprays into the nose as needed.   Yes Historical Provider, MD  naproxen sodium (ANAPROX DS) 550 MG tablet Take 1 tablet (550 mg total) by mouth 2 (two) times daily with a meal. 06/05/13 06/05/14 Yes Ellison Carwin, MD  Omeprazole 20 MG TBEC Take by mouth daily before breakfast.   Yes Historical Provider, MD  SUMAtriptan (IMITREX) 100 MG tablet Take 100 mg by mouth 2 (two) times daily as needed.   Yes Historical Provider, MD  traMADol (ULTRAM) 50 MG tablet Take 1 tablet (50 mg total) by mouth every 8 (eight) hours as needed. 06/05/13  Yes Ellison Carwin, MD  vitamin C (ASCORBIC ACID) 500 MG tablet Take 500 mg  by mouth daily.   Yes Historical Provider, MD  vitamin E 400 UNIT capsule Take 400 Units by mouth daily.   Yes Historical Provider, MD  zonisamide (ZONEGRAN) 100 MG capsule Take 100 mg by mouth 2 (two) times daily.   Yes Historical Provider, MD  cyclobenzaprine (FLEXERIL) 10 MG tablet Take 1 tablet (10 mg total) by mouth 3 (three) times daily as needed for muscle spasms. 06/05/13   Ellison Carwin, MD  LORazepam (ATIVAN) 0.5 MG tablet Take 0.5 mg by mouth every 6 (six) hours as needed.    Historical Provider, MD  METOPROLOL SUCCINATE ER PO Take 25 mg by mouth daily.    Historical Provider, MD  Multiple Vitamins-Minerals (ADEKS) chewable tablet Chew 2 tablets by mouth daily.    Historical Provider, MD   History   Social History  . Marital Status: Married    Spouse Name: N/A    Number of Children: N/A  . Years of Education: N/A   Occupational History  . Not on file.   Social History Main Topics  . Smoking status: Unknown If Ever Smoked  . Smokeless tobacco: Not on file  . Alcohol Use: Yes     Comment: occasional  . Drug Use: Not on file  . Sexual Activity: Not on file   Other Topics Concern  . Not on file   Social History Narrative  . No narrative on file     Review of Systems  Constitutional: Positive for activity change, appetite change and fatigue. Negative for fever, chills and unexpected weight change.  Gastrointestinal: Positive for nausea, abdominal pain, diarrhea, constipation and abdominal distention. Negative for vomiting, blood in stool and anal bleeding.  Genitourinary: Negative for vaginal discharge.  Musculoskeletal: Positive for arthralgias, back pain, gait problem and myalgias. Negative for joint swelling.  Skin: Negative for rash.  Allergic/Immunologic: Positive for environmental allergies.  Hematological: Negative for adenopathy.  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       Objective:   Physical Exam  Nursing note and vitals reviewed. Constitutional: She is oriented to person, place, and time. She appears well-developed and well-nourished. No distress.  HENT:  Head: Normocephalic.  Eyes: Pupils are equal, round, and reactive to light.  Neck: Normal range of motion.  Cardiovascular: Normal rate, regular rhythm and normal heart sounds.   No murmur heard. Pulmonary/Chest: Effort normal and breath sounds normal. No respiratory distress. She has no wheezes. She has no rales.  Abdominal: Soft. She exhibits no distension. There is tenderness. There is no rebound and no guarding.  Hypoactive bowel sounds.mild epigastric tenderness to palpation.    Genitourinary: Vaginal discharge found.  Mild thin grayish discharge. Strawberry petechiae over  the cervix. No cervical motion tenderness.  Uterus and adnexa is normal. Rectum is normal. No stool in vault.  Musculoskeletal: Normal range of motion.  Mild CVA tenderness. Negative straight leg raise bilaterally.  No pain over the lumbar spinous process.  Hamstring tightness.  Pain over the L-5/ L-5 paraspinal muscles.  L worse than R.   Neurological: She is alert and oriented to person, place, and time. No cranial nerve deficit. Coordination normal.  Skin: Skin is warm and dry.  Psychiatric: She has a normal mood and affect. Her behavior is normal.    Filed Vitals:   06/07/13 1400  BP: 120/82  Pulse: 66  Temp: 97.3 F (36.3 C)  TempSrc: Oral  Resp: 18  Weight: 191 lb (86.637 kg)  SpO2: 99%   Results for orders placed in visit on 06/07/13  COMPREHENSIVE METABOLIC PANEL      Result Value Ref Range   Sodium 137  135 - 145 mEq/L   Potassium 4.0  3.5 - 5.3 mEq/L   Chloride 103  96 - 112 mEq/L   CO2 25  19 - 32 mEq/L   Glucose, Bld 111 (*) 70 - 99 mg/dL   BUN 11  6 - 23 mg/dL   Creat 0.86  0.50 - 1.10 mg/dL   Total Bilirubin 0.3  0.2 - 1.2 mg/dL   Alkaline Phosphatase 67  39 - 117 U/L    AST 17  0 - 37 U/L   ALT 19  0 - 35 U/L   Total Protein 7.4  6.0 - 8.3 g/dL   Albumin 4.8  3.5 - 5.2 g/dL   Calcium 9.5  8.4 - 10.5 mg/dL  LIPASE      Result Value Ref Range   Lipase 97 (*) 0 - 75 U/L  POCT UA - MICROSCOPIC ONLY      Result Value Ref Range   WBC, Ur, HPF, POC 1-5     RBC, urine, microscopic neg     Bacteria, U Microscopic trace     Mucus, UA neg     Epithelial cells, urine per micros 0-6     Crystals, Ur, HPF, POC neg     Casts, Ur, LPF, POC neg     Yeast, UA neg    POCT URINALYSIS DIPSTICK      Result Value Ref Range   Color, UA yellow     Clarity, UA clear     Glucose, UA neg     Bilirubin, UA neg     Ketones, UA neg     Spec Grav, UA 1.020     Blood, UA neg     pH, UA 6.5     Protein, UA neg     Urobilinogen, UA 0.2     Nitrite, UA neg     Leukocytes, UA small (1+)    POCT CBC      Result Value Ref Range   WBC 8.1  4.6 - 10.2 K/uL   Lymph, poc 2.6  0.6 - 3.4   POC LYMPH PERCENT 31.7  10 - 50 %L   MID (cbc) 0.5  0 - 0.9   POC MID % 5.8  0 - 12 %M   POC Granulocyte 5.1  2 - 6.9   Granulocyte percent 62.5  37 - 80 %G   RBC 4.41  4.04 - 5.48 M/uL   Hemoglobin 13.4  12.2 - 16.2 g/dL   HCT, POC 41.6  37.7 - 47.9 %   MCV 94.4  80 - 97 fL   MCH, POC 30.4  27 - 31.2 pg   MCHC 32.2  31.8 - 35.4 g/dL   RDW, POC 13.6     Platelet Count, POC 350  142 - 424 K/uL   MPV 8.7  0 - 99.8 fL  IFOBT (OCCULT BLOOD)      Result Value Ref Range   IFOBT Negative    POCT SEDIMENTATION RATE      Result Value Ref Range   POCT SED RATE 32 (*) 0 - 22 mm/hr  POCT WET PREP WITH KOH      Result Value Ref Range   Trichomonas, UA Negative     Clue Cells Wet Prep HPF POC neg     Epithelial Wet Prep HPF POC 3-8     Yeast Wet Prep HPF POC buds     Bacteria Wet Prep HPF POC 3+     RBC Wet Prep HPF POC 0-5     WBC Wet Prep HPF POC tntc     KOH Prep POC Negative    POCT URINE PREGNANCY      Result Value Ref Range   Preg Test, Ur Negative     DIAGNOSTIC STUDIES: Oxygen  Saturation is 99% on RA , normal by my interpretation.    COORDINATION OF CARE:  2:15 PM Discussed course of care with pt . Pt understands and agrees. 3:11 PM Her back pain improved after taking two ibuprofens.     [facility] reading (PRIMARY) by  Dr. Brigitte Pulse. Lumbar spine: Nml Pelvic xray: Nml  CLINICAL DATA: Progressive back pain.  EXAM: PELVIS - 1-2 VIEW  COMPARISON: None.  FINDINGS: The bones of the pelvis are normal. Hips appear normal. Sacroiliac joints are normal. Normal bowel gas pattern.  IMPRESSION: Normal exam.  CLINICAL DATA: 43 year old female with low back pain.  EXAM: LUMBAR SPINE - 2-3 VIEW  COMPARISON: 02/19/2013 radiographs  FINDINGS: Five non rib-bearing lumbar type vertebra are identified.  There is no evidence of acute fracture or subluxation.  The disc spaces are maintained.  No focal bony lesions are identified.  IMPRESSION: Negative.  Assessment & Plan:   Back pain - Plan: POCT UA - Microscopic Only, POCT urinalysis dipstick, DG Lumbar Spine 2-3 Views, DG Pelvis 1-2 Views - unable to tolerate tramadol and cyclobenzaprine due to sedation with meds, start sched mobic and try methocarbamol - suspect functional lumbago. Recheck in 4-6 wks.  Abdominal pain, epigastric - Plan: POCT CBC, POCT SEDIMENTATION RATE, POCT Wet Prep with KOH, POCT urine pregnancy, Comprehensive metabolic panel, Lipase - pt definitely feels that her sxs are different from her IBS but difficult to determine etiology due to multiple current medical concerns/complaints. Check labs and watchful waiting for now. Rec soft bland diet, brat diet.  Diarrhea - Plan: POCT CBC, IFOBT POC (occult bld, rslt in office), POCT SEDIMENTATION RATE, Lipase  Trochanteric bursitis of both hips - given home exercises, start daily mobic but will try to avoid cortisone inj due to DM  Type II or unspecified type diabetes mellitus without mention of complication, not stated as uncontrolled  Meds ordered  this encounter  Medications  . meloxicam (MOBIC) 15 MG tablet    Sig: Take 1 tablet (15 mg total) by mouth daily.    Dispense:  30 tablet    Refill:  0  . methocarbamol (ROBAXIN) 500 MG tablet    Sig: Take 1 tablet (500 mg total) by mouth 4 (four) times daily.    Dispense:  40 tablet    Refill:  0    I personally performed the services described in this documentation, which was scribed in my presence. The recorded information has been reviewed and considered, and addended by me as needed.  Delman Cheadle, MD MPH

## 2013-06-09 ENCOUNTER — Ambulatory Visit (INDEPENDENT_AMBULATORY_CARE_PROVIDER_SITE_OTHER): Payer: 59 | Admitting: Family Medicine

## 2013-06-09 VITALS — BP 120/76 | HR 56 | Temp 98.2°F | Resp 16 | Wt 191.0 lb

## 2013-06-09 DIAGNOSIS — G43909 Migraine, unspecified, not intractable, without status migrainosus: Secondary | ICD-10-CM

## 2013-06-09 DIAGNOSIS — R197 Diarrhea, unspecified: Secondary | ICD-10-CM

## 2013-06-09 DIAGNOSIS — K589 Irritable bowel syndrome without diarrhea: Secondary | ICD-10-CM

## 2013-06-09 DIAGNOSIS — Q453 Other congenital malformations of pancreas and pancreatic duct: Secondary | ICD-10-CM

## 2013-06-09 LAB — POCT CBC
Granulocyte percent: 59.4 %G (ref 37–80)
HCT, POC: 42.4 % (ref 37.7–47.9)
Hemoglobin: 13.7 g/dL (ref 12.2–16.2)
LYMPH, POC: 3.9 — AB (ref 0.6–3.4)
MCH, POC: 30.7 pg (ref 27–31.2)
MCHC: 32.3 g/dL (ref 31.8–35.4)
MCV: 95 fL (ref 80–97)
MID (cbc): 0.6 (ref 0–0.9)
MPV: 8.7 fL (ref 0–99.8)
POC GRANULOCYTE: 6.5 (ref 2–6.9)
POC LYMPH %: 35.2 % (ref 10–50)
POC MID %: 5.4 % (ref 0–12)
Platelet Count, POC: 352 10*3/uL (ref 142–424)
RBC: 4.46 M/uL (ref 4.04–5.48)
RDW, POC: 14.2 %
WBC: 11 10*3/uL — AB (ref 4.6–10.2)

## 2013-06-09 LAB — POCT SEDIMENTATION RATE: POCT SED RATE: 30 mm/hr — AB (ref 0–22)

## 2013-06-09 NOTE — Progress Notes (Signed)
Subjective:    Patient ID: Linda Hebert, female    DOB: 03/28/1970, 43 y.o.   MRN: [id number] Chief Complaint  Patient presents with  . Follow-up    f/u regarding lab results  . Back Pain    pt states she is feeling a little better  . Diarrhea    had an episode of diarrhea this afternoon     HPI  Felt like she was getting a lot better but today she started getting abdominal gurgling and then she had 2 episodes of diarrhea which was very distressing to her. Now abd pain has increased substantially. Her IBS flairs normally just started by food and hasn't eaten anything to cause it. Usually w/ her IBS has LLQ stabbing pains and hasn't had any of this. So she feels very sure that none of her current sxs are from her IBS - feel very different to her. Ate cheerios w/ 1% milk, tea w/ honey, and a gingerale and a little gatorade today. Also had homestyle chicken noodle soup, a few cheeries, ritz crackers today. No personal or family h/o pancreas problems.    Mother had gallbladder removed. MGF had colon cancer at 42 yo PGF EtOH and dec in 60s. MGM had Parkinson's and dec at 42 yo PGM is still living 42 yo w/ Alzheimer's. Both parents w/ kidney stones (father w/ bladder). Past Medical History  Diagnosis Date  . IBS (irritable bowel syndrome)   . Diabetes mellitus    Current Outpatient Prescriptions on File Prior to Visit  Medication Sig Dispense Refill  . calcium carbonate (TUMS - DOSED IN MG ELEMENTAL CALCIUM) 500 MG chewable tablet Chew 1 tablet by mouth daily.      . cetirizine (ZYRTEC) 10 MG tablet Take 10 mg by mouth daily.      . famotidine (PEPCID AC) 10 MG chewable tablet Chew 10 mg by mouth 2 (two) times daily as needed.      Marland Kitchen levothyroxine (SYNTHROID, LEVOTHROID) 25 MCG tablet Take 25 mcg by mouth daily.      . meloxicam (MOBIC) 15 MG tablet Take 1 tablet (15 mg total) by mouth daily.  30 tablet  0  . methocarbamol (ROBAXIN) 500 MG tablet Take 1 tablet (500 mg total) by  mouth 4 (four) times daily.  40 tablet  0  . METOPROLOL SUCCINATE ER PO Take 25 mg by mouth daily.      . mometasone (NASONEX) 50 MCG/ACT nasal spray Place 2 sprays into the nose as needed.      . Multiple Vitamins-Minerals (ADEKS) chewable tablet Chew 2 tablets by mouth daily.      . Omeprazole 20 MG TBEC Take by mouth daily before breakfast.      . SUMAtriptan (IMITREX) 100 MG tablet Take 100 mg by mouth 2 (two) times daily as needed.      . traMADol (ULTRAM) 50 MG tablet Take 1 tablet (50 mg total) by mouth every 8 (eight) hours as needed.  30 tablet  0  . vitamin C (ASCORBIC ACID) 500 MG tablet Take 500 mg by mouth daily.      . vitamin E 400 UNIT capsule Take 400 Units by mouth daily.      Marland Kitchen zonisamide (ZONEGRAN) 100 MG capsule Take 100 mg by mouth 2 (two) times daily.       No current facility-administered medications on file prior to visit.   Allergies  Allergen Reactions  . Bactrim Nausea And Vomiting  . Ciprofloxacin   .  Morphine And Related   . Codeine Nausea Only    Review of Systems  Constitutional: Positive for activity change, appetite change and fatigue. Negative for fever, chills and unexpected weight change.  Respiratory: Negative for shortness of breath.   Cardiovascular: Negative for chest pain and leg swelling.  Gastrointestinal: Positive for nausea, abdominal pain, diarrhea, blood in stool and abdominal distention. Negative for vomiting and constipation.  Genitourinary: Negative for dysuria, decreased urine volume and difficulty urinating.  Musculoskeletal: Positive for back pain. Negative for gait problem.  Skin: Negative for rash.  Neurological: Positive for weakness. Negative for numbness.  Hematological: Negative for adenopathy.  Psychiatric/Behavioral: Positive for confusion. The patient is nervous/anxious.       BP 120/76  Pulse 56  Temp(Src) 98.2 F (36.8 C) (Oral)  Resp 16  Wt 191 lb (86.637 kg)  SpO2 98% Objective:   Physical Exam    Constitutional: She is oriented to person, place, and time. She appears well-developed and well-nourished. No distress.  HENT:  Head: Normocephalic and atraumatic.  Neck: Normal range of motion. Neck supple. No thyromegaly present.  Cardiovascular: Normal rate, regular rhythm, normal heart sounds and intact distal pulses.   Pulmonary/Chest: Effort normal and breath sounds normal. No respiratory distress.  Abdominal: Soft. Normal appearance. Bowel sounds are increased. There is no hepatosplenomegaly. There is generalized tenderness. There is no rigidity, no rebound, no guarding and no CVA tenderness.  Musculoskeletal: She exhibits no edema.  Lymphadenopathy:    She has no cervical adenopathy.  Neurological: She is alert and oriented to person, place, and time.  Skin: Skin is warm and dry. She is not diaphoretic. No erythema.  Psychiatric: She has a normal mood and affect. Her behavior is normal.   Results for orders placed in visit on 06/09/13  STOOL CULTURE      Result Value Ref Range   Preliminary Report No Suspicious Colonies, Continuing to Hold    COMPREHENSIVE METABOLIC PANEL      Result Value Ref Range   Sodium 138  135 - 145 mEq/L   Potassium 4.2  3.5 - 5.3 mEq/L   Chloride 105  96 - 112 mEq/L   CO2 25  19 - 32 mEq/L   Glucose, Bld 93  70 - 99 mg/dL   BUN 16  6 - 23 mg/dL   Creat 0.97  0.50 - 1.10 mg/dL   Total Bilirubin 0.3  0.2 - 1.2 mg/dL   Alkaline Phosphatase 71  39 - 117 U/L   AST 18  0 - 37 U/L   ALT 18  0 - 35 U/L   Total Protein 7.1  6.0 - 8.3 g/dL   Albumin 4.5  3.5 - 5.2 g/dL   Calcium 9.3  8.4 - 10.5 mg/dL  AMYLASE      Result Value Ref Range   Amylase 70  0 - 105 U/L  LIPASE      Result Value Ref Range   Lipase 132 (*) 0 - 75 U/L  FECAL LACTOFERRIN      Result Value Ref Range   Lactoferrin POSITIVE    POCT CBC      Result Value Ref Range   WBC 11.0 (*) 4.6 - 10.2 K/uL   Lymph, poc 3.9 (*) 0.6 - 3.4   POC LYMPH PERCENT 35.2  10 - 50 %L   MID (cbc)  0.6  0 - 0.9   POC MID % 5.4  0 - 12 %M   POC Granulocyte 6.5  2 - 6.9   Granulocyte percent 59.4  37 - 80 %G   RBC 4.46  4.04 - 5.48 M/uL   Hemoglobin 13.7  12.2 - 16.2 g/dL   HCT, POC 42.4  37.7 - 47.9 %   MCV 95.0  80 - 97 fL   MCH, POC 30.7  27 - 31.2 pg   MCHC 32.3  31.8 - 35.4 g/dL   RDW, POC 14.2     Platelet Count, POC 352  142 - 424 K/uL   MPV 8.7  0 - 99.8 fL  POCT SEDIMENTATION RATE      Result Value Ref Range   POCT SED RATE 30 (*) 0 - 22 mm/hr  IFOBT (OCCULT BLOOD)      Result Value Ref Range   IFOBT Positive        Assessment & Plan:   Diarrhea - Plan: POCT CBC, Comprehensive metabolic panel, Amylase, Lipase, POCT SEDIMENTATION RATE, Clostridium difficile EIA, Stool culture, Fecal Lactoferrin, IFOBT POC (occult bld, rslt in office), CT Abdomen Pelvis W Contrast, Ambulatory referral to Gastroenterology  Pancreatic abnormality - Plan: POCT CBC, Comprehensive metabolic panel, Amylase, Lipase, POCT SEDIMENTATION RATE, Clostridium difficile EIA, Stool culture, Fecal Lactoferrin, IFOBT POC (occult bld, rslt in office), Ambulatory referral to Gastroenterology - recheck lipase as was elevated and check CT scan to see if any pancreatic inflammation visible - if there is signs of pancreatic irritation/inflammation on imaging and lipase still elevated, would recommend hospitalization. For now, comply w/ low fat, low carb/sugar diet - specifics discussed w/ pt and low fat diet handout given to pt (see AVS) at her request.  Irritable bowel syndrome - Plan: Ambulatory referral to Gastroenterology - I think that her recurrent c/o diarrhea may be at least in part due to her diarrhea predominant IBS but pt doubtful - check stool studies - if nml/negative could consider prn imodium.  Migraine, unspecified, without mention of intractable migraine without mention of status migrainosus - concern that the zonegran she has been taking for migraine prophylaxis could be cause of sxs (can cause  pancreatitis, abd pain, diarrhea) so if sxs continue, will need to f/u w/ neurology to consider going off med.  +FOBT from home collection - pt with hemorroids and gross blood per pt.  Meds ordered this encounter  Medications  . MAGNESIUM-ZINC PO    Sig: Take 400 mg by mouth once.  . nitrofurantoin, macrocrystal-monohydrate, (MACROBID) 100 MG capsule    Sig: Take 100 mg by mouth as needed.  Marland Kitchen LORazepam (ATIVAN) 0.5 MG tablet    Sig: Take 0.5 mg by mouth every 6 (six) hours as needed for anxiety.  . Zoledronic Acid (RECLAST IV)    Sig: Inject into the vein.    Delman Cheadle, MD MPH

## 2013-06-09 NOTE — Patient Instructions (Addendum)
Acute Pancreatitis Acute pancreatitis is a disease in which the pancreas becomes suddenly inflamed. The pancreas is a large gland located behind your stomach. The pancreas produces enzymes that help digest food. The pancreas also releases the hormones glucagon and insulin that help regulate blood sugar. Damage to the pancreas occurs when the digestive enzymes from the pancreas are activated and begin attacking the pancreas before being released into the intestine. Most acute attacks last a couple of days and can cause serious complications. Some people become dehydrated and develop low blood pressure. In severe cases, bleeding into the pancreas can lead to shock and can be life-threatening. The lungs, heart, and kidneys may fail. CAUSES  Pancreatitis can happen to anyone. In some cases, the cause is unknown. Most cases are caused by:  Alcohol abuse.  Gallstones. Other less common causes are:  Certain medicines.  Exposure to certain chemicals.  Infection.  Damage caused by an accident (trauma).  Abdominal surgery. SYMPTOMS   Pain in the upper abdomen that may radiate to the back.  Tenderness and swelling of the abdomen.  Nausea and vomiting. DIAGNOSIS  Your caregiver will perform a physical exam. Blood and stool tests may be done to confirm the diagnosis. Imaging tests may also be done, such as X-rays, CT scans, or an ultrasound of the abdomen. TREATMENT  Treatment usually requires a stay in the hospital. Treatment may include:  Pain medicine.  Fluid replacement through an intravenous line (IV).  Placing a tube in the stomach to remove stomach contents and control vomiting.  Not eating for 3 or 4 days. This gives your pancreas a rest, because enzymes are not being produced that can cause further damage.  Antibiotic medicines if your condition is caused by an infection.  Surgery of the pancreas or gallbladder. HOME CARE INSTRUCTIONS   Follow the diet advised by your  caregiver. This may involve avoiding alcohol and decreasing the amount of fat in your diet.  Eat smaller, more frequent meals. This reduces the amount of digestive juices the pancreas produces.  Drink enough fluids to keep your urine clear or pale yellow.  Only take over-the-counter or prescription medicines as directed by your caregiver.  Avoid drinking alcohol if it caused your condition.  Do not smoke.  Get plenty of rest.  Check your blood sugar at home as directed by your caregiver.  Keep all follow-up appointments as directed by your caregiver. SEEK MEDICAL CARE IF:   You do not recover as quickly as expected.  You develop new or worsening symptoms.  You have persistent pain, weakness, or nausea.  You recover and then have another episode of pain. SEEK IMMEDIATE MEDICAL CARE IF:   You are unable to eat or keep fluids down.  Your pain becomes severe.  You have a fever or persistent symptoms for more than 2 to 3 days.  You have a fever and your symptoms suddenly get worse.  Your skin or the white part of your eyes turn yellow (jaundice).  You develop vomiting.  You feel dizzy, or you faint.  Your blood sugar is high (over 300 mg/dL). MAKE SURE YOU:   Understand these instructions.  Will watch your condition.  Will get help right away if you are not doing well or get worse. Document Released: 12/31/2004 Document Revised: 07/02/2011 Document Reviewed: 04/11/2011 [facility] Patient Information 2014 [place]. Fat and Cholesterol Control Diet Your diet has an affect on your fat and cholesterol levels in your blood and organs. Too much fat  and cholesterol in your blood can affect your:  Heart.  Blood vessels (arteries, veins).  Gallbladder.  Liver.  Pancreas. CONTROL FAT AND CHOLESTEROL WITH DIET Certain foods raise cholesterol and others lower it. It is important to replace bad fats with other types of fat.  Do not eat:  Fatty meats, such as hot  dogs and salami.  Stick margarine and some tub margarines that have "partially hydrogenated oils" in them.  Baked goods, such as cookies and crackers that have "partially hydrogenated oils" in them.  Saturated tropical oils, such as coconut and palm oil. Eat the following foods:  Round or loin cuts of red meat.  Chicken (without skin).  Fish.  Veal.  [place] breast.  Shellfish.  Fruit, such as apples.  Vegetables, such as broccoli, potatoes, and carrots.  Beans, peas, and lentils (legumes).  Grains, such as barley, rice, couscous, and bulgar wheat.  Pasta (without cream sauces). Look for foods that are nonfat, low in fat, and low in cholesterol.  FIND FOODS THAT ARE LOWER IN FAT AND CHOLESTEROL  Find foods with soluble fiber and plant sterols (phytosterol). You should eat 2 grams a day of these foods. These foods include:  Fruits.  Vegetables.  Whole grains.  Dried beans and peas.  Nuts and seeds.  Read package labels. Look for low-saturated fats, trans fat free, low-fat foods.  Choose cheese that have only 2 to 3 grams of saturated fat per ounce.  Use heart-healthy tub margarine that is free of trans fat or partially hydrogenated oil.  Avoid buying baked goods that have partially hydrogenated oils in them. Instead, buy baked goods made with whole grains (whole-wheat or whole oat flour). Avoid baked goods labeled with "flour" or "enriched flour."  Buy non-creamy canned soups with reduced salt and no added fats. PREPARING YOUR FOOD  Broil, bake, steam, or roast foods. Do not fry food.  Use non-stick cooking sprays.  Use lemon or herbs to flavor food instead of using butter or stick margarine.  Use nonfat yogurt, salsa, or low-fat dressings for salads. LOW-SATURATED FAT / LOW-FAT FOOD SUBSTITUTES  Meats / Saturated Fat (g)  Avoid: Steak, marbled (3 oz/85 g) / 11 g.  Choose: Steak, lean (3 oz/85 g) / 4 g.  Avoid: Hamburger (3 oz/85 g) / 7  g.  Choose: Hamburger, lean (3 oz/85 g) / 5 g.  Avoid: Ham (3 oz/85 g) / 6 g.  Choose: Ham, lean cut (3 oz/85 g) / 2.4 g.  Avoid: Chicken, with skin, dark meat (3 oz/85 g) / 4 g.  Choose: Chicken, skin removed, dark meat (3 oz/85 g) / 2 g.  Avoid: Chicken, with skin, light meat (3 oz/85 g) / 2.5 g.  Choose: Chicken, skin removed, light meat (3 oz/85 g) / 1 g. Dairy / Saturated Fat (g)  Avoid: Whole milk (1 cup) / 5 g.  Choose: Low-fat milk, 2% (1 cup) / 3 g.  Choose: Low-fat milk, 1% (1 cup) / 1.5 g.  Choose: Skim milk (1 cup) / 0.3 g.  Avoid: Hard cheese (1 oz/28 g) / 6 g.  Choose: Skim milk cheese (1 oz/28 g) / 2 to 3 g.  Avoid: Cottage cheese, 4% fat (1 cup) / 6.5 g.  Choose: Low-fat cottage cheese, 1% fat (1 cup) / 1.5 g.  Avoid: Ice cream (1 cup) / 9 g.  Choose: Sherbet (1 cup) / 2.5 g.  Choose: Nonfat frozen yogurt (1 cup) / 0.3 g.  Choose: Frozen fruit bar /  trace.  Avoid: Whipped cream (1 tbs) / 3.5 g.  Choose: Nondairy whipped topping (1 tbs) / 1 g. Condiments / Saturated Fat (g)  Avoid: Mayonnaise (1 tbs) / 2 g.  Choose: Low-fat mayonnaise (1 tbs) / 1 g.  Avoid: Butter (1 tbs) / 7 g.  Choose: Extra light margarine (1 tbs) / 1 g.  Avoid: Coconut oil (1 tbs) / 11.8 g.  Choose: Olive oil (1 tbs) / 1.8 g.  Choose: Corn oil (1 tbs) / 1.7 g.  Choose: Safflower oil (1 tbs) / 1.2 g.  Choose: Sunflower oil (1 tbs) / 1.4 g.  Choose: Soybean oil (1 tbs) / 2.4 g .  Choose: Canola oil (1 tbs) / 1 g. Document Released: 07/02/2011 Document Revised: 09/02/2012 Document Reviewed: 07/02/2011 [facility] Patient Information 2014 [place].

## 2013-06-10 ENCOUNTER — Other Ambulatory Visit: Payer: Self-pay | Admitting: Family Medicine

## 2013-06-10 ENCOUNTER — Telehealth: Payer: Self-pay | Admitting: *Deleted

## 2013-06-10 ENCOUNTER — Ambulatory Visit
Admission: RE | Admit: 2013-06-10 | Discharge: 2013-06-10 | Disposition: A | Discharge status: Home / Self Care | Payer: 59 | Source: Ambulatory Visit | Attending: Family Medicine | Admitting: Family Medicine

## 2013-06-10 DIAGNOSIS — R197 Diarrhea, unspecified: Secondary | ICD-10-CM

## 2013-06-10 LAB — COMPREHENSIVE METABOLIC PANEL
ALT: 18 U/L (ref 0–35)
AST: 18 U/L (ref 0–37)
Albumin: 4.5 g/dL (ref 3.5–5.2)
Alkaline Phosphatase: 71 U/L (ref 39–117)
BILIRUBIN TOTAL: 0.3 mg/dL (ref 0.2–1.2)
BUN: 16 mg/dL (ref 6–23)
CO2: 25 mEq/L (ref 19–32)
Calcium: 9.3 mg/dL (ref 8.4–10.5)
Chloride: 105 mEq/L (ref 96–112)
Creat: 0.97 mg/dL (ref 0.50–1.10)
Glucose, Bld: 93 mg/dL (ref 70–99)
Potassium: 4.2 mEq/L (ref 3.5–5.3)
SODIUM: 138 meq/L (ref 135–145)
TOTAL PROTEIN: 7.1 g/dL (ref 6.0–8.3)

## 2013-06-10 LAB — IFOBT (OCCULT BLOOD): IFOBT: POSITIVE

## 2013-06-10 LAB — LIPASE: LIPASE: 132 U/L — AB (ref 0–75)

## 2013-06-10 LAB — AMYLASE: Amylase: 70 U/L (ref 0–105)

## 2013-06-10 MED ORDER — IOHEXOL 300 MG/ML  SOLN
100.0000 mL | Freq: Once | INTRAMUSCULAR | Status: AC | PRN
  Administered 2013-06-10: 100 mL via INTRAVENOUS

## 2013-06-10 NOTE — Telephone Encounter (Signed)
Would recommend clear liquids for now - broth, jello, popsicles, tea, etc - as this will allow her pancreas to rest/heal.  In the next day or so, can advance to soft, low-fat diet as tolerated

## 2013-06-10 NOTE — Telephone Encounter (Signed)
Linda Hebert called to give report- under the imaging tab Pt would like a call on her cell phone [phone] with results.

## 2013-06-10 NOTE — Telephone Encounter (Signed)
PATIENT WAS TOLD BY DR. SHAW THAT SHE SHOULD EAT MORE SOUPS AND PATIENT WOULD LIKE TO KNOW WHAT ELSE SHE COULD EAT BESIDES JUST SOUP. PATIENT PHONE: [phone]

## 2013-06-10 NOTE — Telephone Encounter (Signed)
BRAT diet

## 2013-06-10 NOTE — Telephone Encounter (Signed)
Discussed labs and CT scan w/ pt - CT scan reassuringly normal. - mildly enlarge CBD probably nothing but could have passed a gallstone. Labs normal other than elevated lipase which continues to increase. Amylase nml and no pancreatic irritation seen on imaging. + IFOBT probably from bleeding hemorrhoids (saw bright red blood and has known hemorrhoids). Pt recommended to try to comply w/ low fat diet and push fluids. Will refer stat to GI - hopefully can get appt for next week due to diarrhea w/ elevated lipase.  If pain, nausea/vomiting, or diarrhea increases over weekend pt to RTC to see if needs IVF and recheck pancreas.

## 2013-06-11 ENCOUNTER — Telehealth: Payer: Self-pay

## 2013-06-11 LAB — C. DIFFICILE GDH AND TOXIN A/B
C. DIFFICILE GDH: NOT DETECTED
C. difficile Toxin A/B: NOT DETECTED

## 2013-06-11 LAB — FECAL LACTOFERRIN, QUANT: Lactoferrin: POSITIVE

## 2013-06-11 NOTE — Telephone Encounter (Signed)
I will be in clinic tomorrow so if pt is getting worse can come in. Seeing Dr. Collene Mares would be great so alerted Butch Penny. Pt was advised just to stay on low fat diet but also needs to not eat to many carbs or sugar. Do not consume more than 20 grams of fat a day and MUCH LESS would be best. Eating boneless chicken breasts and most fish should be good for her. Cooking with Pam or any cooking spray instead of oils also helps. She can add fat-free chicken broth when food needs moisture. Beans should be great. Veggies should be fine.

## 2013-06-11 NOTE — Telephone Encounter (Signed)
Pt.notified

## 2013-06-11 NOTE — Telephone Encounter (Signed)
Great for pt to see Dr. Collene Mares due to concern for abd pain and pancreatic irritation.

## 2013-06-11 NOTE — Telephone Encounter (Signed)
Patient states that she is very gassy and having stomach pain.  Advised patient that she could have broth, jello, popsicles, tea, etc to help her pancreas have time to rest and to heal.  In the next day or so she can advance to a soft, low fat diet as tolerated.  Patient sounded very frustrated and said she is not understanding what is going on with her.  She stated that "they" seem to not know what is going on with her, and she would really like to speak to Dr. Brigitte Pulse if possible.  I told her I would send a message to Dr. Brigitte Pulse. She seemed to be very grateful for this.

## 2013-06-11 NOTE — Telephone Encounter (Signed)
Dr. Brigitte Pulse, pt has seen Dr. Collene Mares in the past and has had a colonoscopy with her. She wants to know if we can send her referral to Dr. Collene Mares? If this is ok, will you send this message to Marja Kays so that she can change it because she has already sent the request to [place]. Thanks

## 2013-06-14 LAB — STOOL CULTURE

## 2013-06-17 ENCOUNTER — Other Ambulatory Visit: Payer: Self-pay | Admitting: Gastroenterology

## 2013-06-17 DIAGNOSIS — R748 Abnormal levels of other serum enzymes: Secondary | ICD-10-CM

## 2013-06-17 DIAGNOSIS — R1013 Epigastric pain: Secondary | ICD-10-CM

## 2013-06-27 ENCOUNTER — Ambulatory Visit
Admission: RE | Admit: 2013-06-27 | Discharge: 2013-06-27 | Disposition: A | Discharge status: Home / Self Care | Payer: 59 | Source: Ambulatory Visit | Attending: Gastroenterology | Admitting: Gastroenterology

## 2013-06-27 DIAGNOSIS — R1013 Epigastric pain: Secondary | ICD-10-CM

## 2013-06-27 DIAGNOSIS — R748 Abnormal levels of other serum enzymes: Secondary | ICD-10-CM

## 2013-06-27 MED ORDER — GADOBENATE DIMEGLUMINE 529 MG/ML IV SOLN
17.0000 mL | Freq: Once | INTRAVENOUS | Status: AC | PRN
  Administered 2013-06-27: 17 mL via INTRAVENOUS

## 2014-04-03 ENCOUNTER — Ambulatory Visit (INDEPENDENT_AMBULATORY_CARE_PROVIDER_SITE_OTHER): Payer: 59 | Admitting: Physician Assistant

## 2014-04-03 ENCOUNTER — Ambulatory Visit (INDEPENDENT_AMBULATORY_CARE_PROVIDER_SITE_OTHER): Payer: 59

## 2014-04-03 VITALS — BP 128/90 | HR 60 | Temp 97.9°F | Resp 18 | Ht 65.0 in | Wt 191.4 lb

## 2014-04-03 DIAGNOSIS — M545 Low back pain, unspecified: Secondary | ICD-10-CM

## 2014-04-03 DIAGNOSIS — N3000 Acute cystitis without hematuria: Secondary | ICD-10-CM | POA: Diagnosis not present

## 2014-04-03 DIAGNOSIS — K5900 Constipation, unspecified: Secondary | ICD-10-CM | POA: Diagnosis not present

## 2014-04-03 LAB — POCT URINALYSIS DIPSTICK
Bilirubin, UA: NEGATIVE
Glucose, UA: NEGATIVE
Ketones, UA: NEGATIVE
Nitrite, UA: NEGATIVE
Protein, UA: NEGATIVE
Spec Grav, UA: 1.015
Urobilinogen, UA: 0.2
pH, UA: 6.5

## 2014-04-03 LAB — POCT UA - MICROSCOPIC ONLY
CASTS, UR, LPF, POC: NEGATIVE
Crystals, Ur, HPF, POC: NEGATIVE
Mucus, UA: NEGATIVE
YEAST UA: NEGATIVE

## 2014-04-03 MED ORDER — NAPROXEN 500 MG PO TABS: 500.0000 mg | ORAL_TABLET | Freq: Two times a day (BID) | ORAL | Status: DC

## 2014-04-03 MED ORDER — POLYETHYLENE GLYCOL 3350 17 GM/SCOOP PO POWD: 17.0000 g | Freq: Two times a day (BID) | ORAL | Status: DC | PRN

## 2014-04-03 MED ORDER — NITROFURANTOIN MONOHYD MACRO 100 MG PO CAPS: 100.0000 mg | ORAL_CAPSULE | Freq: Two times a day (BID) | ORAL | Status: DC

## 2014-04-03 NOTE — Patient Instructions (Signed)
Constipation  Constipation is when a person has fewer than three bowel movements a week, has difficulty having a bowel movement, or has stools that are dry, hard, or larger than normal. As people grow older, constipation is more common. If you try to fix constipation with medicines that make you have a bowel movement (laxatives), the problem may get worse. Long-term laxative use may cause the muscles of the colon to become weak. A low-fiber diet, not taking in enough fluids, and taking certain medicines may make constipation worse.   CAUSES   · Certain medicines, such as antidepressants, pain medicine, iron supplements, antacids, and water pills.    · Certain diseases, such as diabetes, irritable bowel syndrome (IBS), thyroid disease, or depression.    · Not drinking enough water.    · Not eating enough fiber-rich foods.    · Stress or travel.    · Lack of physical activity or exercise.    · Ignoring the urge to have a bowel movement.    · Using laxatives too much.    SIGNS AND SYMPTOMS   · Having fewer than three bowel movements a week.    · Straining to have a bowel movement.    · Having stools that are hard, dry, or larger than normal.    · Feeling full or bloated.    · Pain in the lower abdomen.    · Not feeling relief after having a bowel movement.    DIAGNOSIS   Your health care provider will take a medical history and perform a physical exam. Further testing may be done for severe constipation. Some tests may include:  · A barium enema X-ray to examine your rectum, colon, and, sometimes, your small intestine.    · A sigmoidoscopy to examine your lower colon.    · A colonoscopy to examine your entire colon.  TREATMENT   Treatment will depend on the severity of your constipation and what is causing it. Some dietary treatments include drinking more fluids and eating more fiber-rich foods. Lifestyle treatments may include regular exercise. If these diet and lifestyle recommendations do not help, your health care  provider may recommend taking over-the-counter laxative medicines to help you have bowel movements. Prescription medicines may be prescribed if over-the-counter medicines do not work.   HOME CARE INSTRUCTIONS   · Eat foods that have a lot of fiber, such as fruits, vegetables, whole grains, and beans.  · Limit foods high in fat and processed sugars, such as french fries, hamburgers, cookies, candies, and soda.    · A fiber supplement may be added to your diet if you cannot get enough fiber from foods.    · Drink enough fluids to keep your urine clear or pale yellow.    · Exercise regularly or as directed by your health care provider.    · Go to the restroom when you have the urge to go. Do not hold it.    · Only take over-the-counter or prescription medicines as directed by your health care provider. Do not take other medicines for constipation without talking to your health care provider first.    SEEK IMMEDIATE MEDICAL CARE IF:   · You have bright red blood in your stool.    · Your constipation lasts for more than 4 days or gets worse.    · You have abdominal or rectal pain.    · You have thin, pencil-like stools.    · You have unexplained weight loss.  MAKE SURE YOU:   · Understand these instructions.  · Will watch your condition.  · Will get help right away if you are not   you have with your health care provider.   Back Pain, Adult Low back pain is very common. About 1 in 5 people have back pain.The cause of low back pain is rarely dangerous. The pain often gets better over time.About half of people with a sudden onset of back pain feel better in just 2 weeks. About 8 in 10 people feel better by 6 weeks.   CAUSES Some common causes of back pain include:  Strain of the muscles or ligaments supporting the spine.  Wear and tear (degeneration) of the spinal discs.  Arthritis.  Direct injury to the back. DIAGNOSIS Most of the time, the direct cause of low back pain is not known.However, back pain can be treated effectively even when the exact cause of the pain is unknown.Answering your caregiver's questions about your overall health and symptoms is one of the most accurate ways to make sure the cause of your pain is not dangerous. If your caregiver needs more information, he or she may order lab work or imaging tests (X-rays or MRIs).However, even if imaging tests show changes in your back, this usually does not require surgery. HOME CARE INSTRUCTIONS For many people, back pain returns.Since low back pain is rarely dangerous, it is often a condition that people can learn to [facility] their own.   Remain active. It is stressful on the back to sit or stand in one place. Do not sit, drive, or stand in one place for more than 30 minutes at a time. Take short walks on level surfaces as soon as pain allows.Try to increase the length of time you walk each day.  Do not stay in bed.Resting more than 1 or 2 days can delay your recovery.  Do not avoid exercise or work.Your body is made to move.It is not dangerous to be active, even though your back may hurt.Your back will likely heal faster if you return to being active before your pain is gone.  Pay attention to your body when you bend and lift. Many people have less discomfortwhen lifting if they bend their knees, keep the load close to their bodies,and avoid twisting. Often, the most comfortable positions are those that put less stress on your recovering back.  Find a comfortable position to sleep. Use a firm mattress and lie on your side with your knees slightly bent. If you lie on your back, put a pillow under your knees.  Only take  over-the-counter or prescription medicines as directed by your caregiver. Over-the-counter medicines to reduce pain and inflammation are often the most helpful.Your caregiver may prescribe muscle relaxant drugs.These medicines help dull your pain so you can more quickly return to your normal activities and healthy exercise.  Put ice on the injured area.  Put ice in a plastic bag.  Place a towel between your skin and the bag.  Leave the ice on for 15-20 minutes, 03-04 times a day for the first 2 to 3 days. After that, ice and heat may be alternated to reduce pain and spasms.  Ask your caregiver about trying back exercises and gentle massage. This may be of some benefit.  Avoid feeling anxious or stressed.Stress increases muscle tension and can worsen back pain.It is important to recognize when you are anxious or stressed and learn ways to manage it.Exercise is a great option. SEEK MEDICAL CARE IF:  You have pain that is not relieved with rest or medicine.  You have pain that does not improve in 1 week.  You have  new symptoms.  You are generally not feeling well. SEEK IMMEDIATE MEDICAL CARE IF:   You have pain that radiates from your back into your legs.  You develop new bowel or bladder control problems.  You have unusual weakness or numbness in your arms or legs.  You develop nausea or vomiting.  You develop abdominal pain.  You feel faint. Document Released: 12/31/2004 Document Revised: 07/02/2011 Document Reviewed: 05/04/2013 [facility] Patient Information 2015 [place], [place]. This information is not intended to replace advice given to you by your health care provider. Make sure you discuss any questions you have with your health care provider.

## 2014-04-03 NOTE — Progress Notes (Signed)
Subjective:    Patient ID: Linda Hebert, female    DOB: 1970-09-29, 44 y.o.   MRN: [id number]  HPI Patient presents for 4 weeks of right sided lower back pain that sometimes radiates to left side. Pain is 5/10 and achy in quality. Denies trauma or falls recently. Denies numbness/tingling of lower extremity, loss of ROM/fxn/sensation, incontinence, gait change, neck/head pain. Has tried OTC medication without relief. Does not recall if pain was similar to back pain from last year. 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 Additionally had one day of flank pain this week, but no other urinary sx. Denies N/V, D/C, fever, change in appetite, hematuria. Does not have regular bowel movements, but no pain with defecation.   Review of Systems  Constitutional: Negative for fever, chills and appetite change.  Gastrointestinal: Negative for nausea, vomiting, abdominal pain, diarrhea and constipation.  Genitourinary: Positive for flank pain. Negative for dysuria, urgency, frequency, hematuria, decreased urine volume, vaginal bleeding, vaginal discharge, difficulty urinating, menstrual problem, pelvic pain and dyspareunia.  Musculoskeletal: Positive for back pain. Negative for myalgias, joint swelling, arthralgias, gait problem, neck pain and neck stiffness.  Neurological: Negative for weakness, numbness and headaches.       Objective:   Physical Exam  Constitutional: She is oriented to person, place, and time. She appears well-developed and well-nourished. No distress.  Blood pressure 128/90, pulse 60, temperature 97.9 F (36.6 C), temperature source Oral, resp. rate 18, height 5\' 5"  (1.651 m), weight 191 lb 6.4 oz (86.818 kg).  HENT:  Head: Normocephalic and atraumatic.  Right Ear: External ear normal.  Left Ear: External ear normal.  Mouth/Throat: Oropharynx is clear and moist. No oropharyngeal exudate.  Eyes: Conjunctivae and EOM are normal. Pupils are equal,  round, and reactive to light. Right eye exhibits no discharge. Left eye exhibits no discharge. No scleral icterus.  Neck: Normal range of motion. Neck supple. No thyromegaly present.  Cardiovascular: Normal rate, regular rhythm and normal heart sounds.  Exam reveals no gallop and no friction rub.   No murmur heard. Pulmonary/Chest: Effort normal and breath sounds normal. No respiratory distress. She has no wheezes. She has no rales.  Abdominal: Soft. Bowel sounds are normal. She exhibits no distension and no mass. There is no tenderness. There is no rebound and no guarding.  Musculoskeletal: Normal range of motion. She exhibits tenderness. She exhibits no edema.       Cervical back: Normal.       Thoracic back: Normal.       Lumbar back: She exhibits tenderness and pain. She exhibits normal range of motion, no bony tenderness, no swelling, no edema, no deformity, no laceration and no spasm.       Arms: Lymphadenopathy:    She has no cervical adenopathy.  Neurological: She is alert and oriented to person, place, and time. She has normal strength and normal reflexes. She displays no atrophy and normal reflexes. No cranial nerve deficit or sensory deficit. She exhibits normal muscle tone. Coordination normal.  Skin: Skin is warm and dry. No rash noted. She is not diaphoretic. No erythema. No pallor.   Results for orders placed or performed in visit on 04/03/14  POCT urinalysis dipstick  Result Value Ref Range   Color, UA yellow    Clarity, UA clear    Glucose, UA neg    Bilirubin, UA neg    Ketones, UA neg    Spec Grav, UA 1.015  Blood, UA trace    pH, UA 6.5    Protein, UA neg    Urobilinogen, UA 0.2    Nitrite, UA neg    Leukocytes, UA small (1+)   POCT UA - Microscopic Only  Result Value Ref Range   WBC, Ur, HPF, POC 5-10    RBC, urine, microscopic 1-3    Bacteria, U Microscopic trace    Mucus, UA neg    Epithelial cells, urine per micros 2-4    Crystals, Ur, HPF, POC neg     Casts, Ur, LPF, POC neg    Yeast, UA neg    [facility] reading (PRIMARY) by  Dr. Ouida Sills. No acute fractures noted. Heavy stool burden present.     Assessment & Plan:  1. Right-sided low back pain without sciatica Ice 15-20 min 3-4x daily. - DG Lumbar Spine Complete; Future - POCT urinalysis dipstick - POCT UA - Microscopic Only - naproxen (NAPROSYN) 500 MG tablet; Take 1 tablet (500 mg total) by mouth 2 (two) times daily with a meal.  Dispense: 30 tablet; Refill: 0  2. Acute cystitis without hematuria - nitrofurantoin, macrocrystal-monohydrate, (MACROBID) 100 MG capsule; Take 1 capsule (100 mg total) by mouth 2 (two) times daily.  Dispense: 20 capsule; Refill: 0  3. Constipation, unspecified constipation type Increase water in take to 64 oz daily. Increase veggie, fruit, and fiber intake. Get Fleet enema OTC. Do one enema today and another in 2 days.  - polyethylene glycol powder (GLYCOLAX/MIRALAX) powder; Take 17 g by mouth 2 (two) times daily as needed.  Dispense: 3350 g; Refill: 1   Alya Smaltz PA-C  [facility] 04/03/2014 11:57 AM

## 2014-04-06 NOTE — Progress Notes (Signed)
  Medical screening examination/treatment/procedure(s) were performed by non-physician practitioner and as supervising physician I was immediately available for consultation/collaboration.     

## 2014-04-16 ENCOUNTER — Ambulatory Visit (INDEPENDENT_AMBULATORY_CARE_PROVIDER_SITE_OTHER): Payer: 59 | Admitting: Family Medicine

## 2014-04-16 VITALS — BP 110/80 | HR 76 | Temp 97.9°F | Resp 18 | Wt 188.2 lb

## 2014-04-16 DIAGNOSIS — R319 Hematuria, unspecified: Secondary | ICD-10-CM

## 2014-04-16 DIAGNOSIS — R42 Dizziness and giddiness: Secondary | ICD-10-CM

## 2014-04-16 DIAGNOSIS — R197 Diarrhea, unspecified: Secondary | ICD-10-CM | POA: Diagnosis not present

## 2014-04-16 DIAGNOSIS — R109 Unspecified abdominal pain: Secondary | ICD-10-CM

## 2014-04-16 LAB — COMPREHENSIVE METABOLIC PANEL
ALBUMIN: 4.2 g/dL (ref 3.5–5.2)
ALT: 37 U/L — ABNORMAL HIGH (ref 0–35)
AST: 25 U/L (ref 0–37)
Alkaline Phosphatase: 66 U/L (ref 39–117)
BILIRUBIN TOTAL: 0.3 mg/dL (ref 0.2–1.2)
BUN: 12 mg/dL (ref 6–23)
CHLORIDE: 111 meq/L (ref 96–112)
CO2: 20 mEq/L (ref 19–32)
CREATININE: 0.84 mg/dL (ref 0.50–1.10)
Calcium: 8.6 mg/dL (ref 8.4–10.5)
Glucose, Bld: 90 mg/dL (ref 70–99)
POTASSIUM: 3.8 meq/L (ref 3.5–5.3)
Sodium: 141 mEq/L (ref 135–145)
TOTAL PROTEIN: 6.9 g/dL (ref 6.0–8.3)

## 2014-04-16 LAB — POCT URINALYSIS DIPSTICK
Bilirubin, UA: NEGATIVE
GLUCOSE UA: NEGATIVE
KETONES UA: NEGATIVE
NITRITE UA: NEGATIVE
SPEC GRAV UA: 1.025
UROBILINOGEN UA: 0.2
pH, UA: 5

## 2014-04-16 LAB — POCT UA - MICROSCOPIC ONLY
CASTS, UR, LPF, POC: NEGATIVE
CRYSTALS, UR, HPF, POC: NEGATIVE
Mucus, UA: NEGATIVE
YEAST UA: NEGATIVE

## 2014-04-16 LAB — POCT CBC
GRANULOCYTE PERCENT: 61.1 % (ref 37–80)
HCT, POC: 41.5 % (ref 37.7–47.9)
Hemoglobin: 13.7 g/dL (ref 12.2–16.2)
LYMPH, POC: 2.1 (ref 0.6–3.4)
MCH, POC: 29.5 pg (ref 27–31.2)
MCHC: 33.1 g/dL (ref 31.8–35.4)
MCV: 89.1 fL (ref 80–97)
MID (cbc): 0.4 (ref 0–0.9)
MPV: 7.2 fL (ref 0–99.8)
POC Granulocyte: 4 (ref 2–6.9)
POC LYMPH %: 32.2 % (ref 10–50)
POC MID %: 6.7 %M (ref 0–12)
Platelet Count, POC: 272 10*3/uL (ref 142–424)
RBC: 4.65 M/uL (ref 4.04–5.48)
RDW, POC: 14.9 %
WBC: 6.5 10*3/uL (ref 4.6–10.2)

## 2014-04-16 LAB — POCT WET PREP WITH KOH
KOH Prep POC: NEGATIVE
TRICHOMONAS UA: NEGATIVE
Yeast Wet Prep HPF POC: NEGATIVE

## 2014-04-16 LAB — POCT URINE PREGNANCY: PREG TEST UR: NEGATIVE

## 2014-04-16 LAB — GLUCOSE, POCT (MANUAL RESULT ENTRY): POC Glucose: 85 mg/dl (ref 70–99)

## 2014-04-16 MED ORDER — NITROFURANTOIN MONOHYD MACRO 100 MG PO CAPS: 100.0000 mg | ORAL_CAPSULE | Freq: Two times a day (BID) | ORAL | Status: DC

## 2014-04-16 NOTE — Progress Notes (Signed)
Subjective:    Patient ID: Linda Hebert, female    DOB: 10-Aug-1970, 44 y.o.   MRN: [id number]  HPI Patient presents for abdominal pain and diarrhea.  Was seen in clinic 04/03/14 and treated for UTI and constipation. Abdominal and back pain resolved, however, left abdominal pain has returned. Two nights ago while at work began to feel nauseous, dizzy, flushed, and confused. Coworker notices that she looked pale as well. When she returned home that night, abdominal pain had returned. She was having daily, loose stool due to treatment with Miralax and Fleet 2 weeks, but husband suggested home enema with mix of water and salt. Since the home enema has had diarrhea every 2-3 hours with watery stools. No blood or mucus in stools. Additional symptoms include decreased appetite, belching, and feeling weak. Denies fever, chills, sweating, weight loss, urinary/vaginal sx. H/o IBS that has not unaffected patient in over 5 years.   Back pain resolved. Completed antibiotic course. No longer using Miralax.   Review of Systems  Constitutional: Positive for appetite change. Negative for fever, chills, diaphoresis, activity change, fatigue and unexpected weight change.  Gastrointestinal: Positive for nausea, abdominal pain and diarrhea. Negative for vomiting and constipation (resolved).  Genitourinary: Negative for dysuria, urgency, frequency, hematuria, flank pain, decreased urine volume, vaginal bleeding, vaginal discharge, difficulty urinating, vaginal pain, menstrual problem and pelvic pain.  Neurological: Positive for dizziness (resolved). Negative for weakness, light-headedness and headaches.       Objective:   Physical Exam  Constitutional: She is oriented to person, place, and time. She appears well-developed and well-nourished. No distress.  Blood pressure 110/80, pulse 76, temperature 97.9 F (36.6 C), temperature source Oral, resp. rate 18, weight 188 lb 3.2 oz (85.367 kg), SpO2 98 %.    HENT:  Head: Normocephalic and atraumatic.  Right Ear: External ear normal.  Left Ear: External ear normal.  Eyes: Conjunctivae are normal. Pupils are equal, round, and reactive to light. Right eye exhibits no discharge. Left eye exhibits no discharge. No scleral icterus.  Neck: Neck supple. No thyromegaly present.  Cardiovascular: Normal rate, regular rhythm and normal heart sounds.  Exam reveals no gallop and no friction rub.   No murmur heard. Pulmonary/Chest: Effort normal and breath sounds normal. No respiratory distress. She has no wheezes. She has no rales.  Abdominal: Soft. Bowel sounds are normal. She exhibits no distension and no mass. There is no hepatosplenomegaly. There is tenderness in the periumbilical area and suprapubic area. There is no rigidity, no rebound, no guarding, no CVA tenderness, no tenderness at McBurney's point and negative Murphy's sign. No hernia.  Genitourinary: Vagina normal. No vaginal discharge found.  Lymphadenopathy:    She has no cervical adenopathy.  Neurological: She is alert and oriented to person, place, and time.  Skin: Skin is warm and dry. No rash noted. She is not diaphoretic. No erythema. No pallor.   Results for orders placed or performed in visit on 04/16/14  POCT CBC  Result Value Ref Range   WBC 6.5 4.6 - 10.2 K/uL   Lymph, poc 2.1 0.6 - 3.4   POC LYMPH PERCENT 32.2 10 - 50 %L   MID (cbc) 0.4 0 - 0.9   POC MID % 6.7 0 - 12 %M   POC Granulocyte 4.0 2 - 6.9   Granulocyte percent 61.1 37 - 80 %G   RBC 4.65 4.04 - 5.48 M/uL   Hemoglobin 13.7 12.2 - 16.2 g/dL   HCT, POC 41.5 37.7 -  47.9 %   MCV 89.1 80 - 97 fL   MCH, POC 29.5 27 - 31.2 pg   MCHC 33.1 31.8 - 35.4 g/dL   RDW, POC 14.9 %   Platelet Count, POC 272.0 142 - 424 K/uL   MPV 7.2 0 - 99.8 fL  POCT glucose (manual entry)  Result Value Ref Range   POC Glucose 85 70 - 99 mg/dl  POCT urinalysis dipstick  Result Value Ref Range   Color, UA Yellow    Clarity, UA Cloudy     Glucose, UA Negative    Bilirubin, UA Negative    Ketones, UA Negative    Spec Grav, UA 1.025    Blood, UA trace-intact    pH, UA 5.0    Protein, UA Trace    Urobilinogen, UA 0.2    Nitrite, UA Negative    Leukocytes, UA small (1+)   POCT UA - Microscopic Only  Result Value Ref Range   WBC, Ur, HPF, POC 6-10    RBC, urine, microscopic 2-4    Bacteria, U Microscopic 1+    Mucus, UA Negative    Epithelial cells, urine per micros 2-8    Crystals, Ur, HPF, POC Negative    Casts, Ur, LPF, POC Negative    Yeast, UA Negative    Renal tubular cells 0-2   POCT urine pregnancy  Result Value Ref Range   Preg Test, Ur Negative   POCT Wet Prep with KOH  Result Value Ref Range   Trichomonas, UA Negative    Clue Cells Wet Prep HPF POC 0-1    Epithelial Wet Prep HPF POC 5-8    Yeast Wet Prep HPF POC Negative    Bacteria Wet Prep HPF POC Trace    RBC Wet Prep HPF POC 0-2    WBC Wet Prep HPF POC 3-5    KOH Prep POC Negative       Assessment & Plan:  1. Abdominal pain, unspecified abdominal location - POCT urinalysis dipstick - POCT UA - Microscopic Only - POCT urine pregnancy - Pap IG, CT/NG w/ reflex HPV when ASC-U - POCT Wet Prep with KOH  2. Diarrhea Will hold off on treating diarrhea as it has already started to slow down. Possibly IBS flare secondary to home enema or due to home enema. Should discontinue enemas all together for now. If IBS becomes an issue in the future can discuss starting Bentyl, however, will hold off until sure this was not induced by enema itself.  - POCT CBC - Comprehensive metabolic panel  3. Dizzy Resolved. - POCT glucose (manual entry)  4. Hematuria - Ambulatory referral to Urology - nitrofurantoin, macrocrystal-monohydrate, (MACROBID) 100 MG capsule; Take 1 capsule (100 mg total) by mouth 2 (two) times daily.  Dispense: 20 capsule; Refill: 0 - Urine culture   Alveta Heimlich PA-C  [facility] 04/16/2014 3:25 PM

## 2014-04-18 LAB — PAP IG, CT-NG, RFX HPV ASCU
Chlamydia Probe Amp: NEGATIVE
GC Probe Amp: NEGATIVE

## 2014-04-18 LAB — URINE CULTURE: Colony Count: 50000

## 2014-04-20 ENCOUNTER — Telehealth: Payer: Self-pay | Admitting: Physician Assistant

## 2014-04-20 NOTE — Telephone Encounter (Signed)
Left vmail. Should finish antibiotic. Rest of labs negative.

## 2014-06-23 ENCOUNTER — Ambulatory Visit (INDEPENDENT_AMBULATORY_CARE_PROVIDER_SITE_OTHER): Payer: 59 | Admitting: Urgent Care

## 2014-06-23 ENCOUNTER — Ambulatory Visit (INDEPENDENT_AMBULATORY_CARE_PROVIDER_SITE_OTHER): Payer: 59

## 2014-06-23 ENCOUNTER — Encounter: Payer: Self-pay | Admitting: Urgent Care

## 2014-06-23 ENCOUNTER — Telehealth: Payer: Self-pay | Admitting: Physician Assistant

## 2014-06-23 VITALS — BP 124/92 | HR 65 | Temp 97.9°F | Resp 16 | Ht 65.5 in | Wt 190.6 lb

## 2014-06-23 DIAGNOSIS — M545 Low back pain, unspecified: Secondary | ICD-10-CM

## 2014-06-23 DIAGNOSIS — K5900 Constipation, unspecified: Secondary | ICD-10-CM

## 2014-06-23 DIAGNOSIS — M62830 Muscle spasm of back: Secondary | ICD-10-CM | POA: Diagnosis not present

## 2014-06-23 MED ORDER — CYCLOBENZAPRINE HCL 10 MG PO TABS: 5.0000 mg | ORAL_TABLET | Freq: Every day | ORAL | Status: DC

## 2014-06-23 MED ORDER — MELOXICAM 15 MG PO TABS: 7.5000 mg | ORAL_TABLET | Freq: Every day | ORAL | Status: DC

## 2014-06-23 MED ORDER — DOCUSATE SODIUM 50 MG PO CAPS: 50.0000 mg | ORAL_CAPSULE | Freq: Two times a day (BID) | ORAL | Status: DC | PRN

## 2014-06-23 NOTE — Telephone Encounter (Addendum)
Patient states that she is having low back pain. She is not sure if it is related to the issues she was having back in April which she saw Tishira for. Advised patient to come in for an OV to be evaluted. She is scheduled at 3:15pm today with Jaynee Eagles. Just FYI.

## 2014-06-23 NOTE — Progress Notes (Signed)
    MRN: [id number] DOB: [birth year]  Subjective:   Linda Hebert is a 44 y.o. female presenting for chief complaint of Back Pain  Reports 1 month history of low back pain, worse in the last week, pain is dull, achy, comes in waves, worse with sudden movements, nonradiating. Has a history of constipation as seen on complete lumbar series in 04/03/2014. Has tried ibuprofen with minimal relief. She did not end up using MiraLax, reports that it caused significant diarrhea after one use. Patient works as a Estate agent at [employer], her job is mostly sedentary but from time to time has to stand for long periods. She tries to practice healthy diet but does not exercise. Denies fevers, nausea, vomiting, abdominal pain, hard stools, straining, bloody stool, saddle paresthesias, shooting pain, numbness and tingling. Denies any other aggravating or relieving factors, no other questions or concerns.  Linda Hebert has a current medication list which includes the following prescription(s): calcium carbonate, levothyroxine, lorazepam, magnesium-zinc, metoprolol succinate, mometasone, adeks, naproxen, nitrofurantoin (macrocrystal-monohydrate), omeprazole, sumatriptan, vitamin c, vitamin e, zoledronic acid, zonisamide, famotidine, and polyethylene glycol powder. She is allergic to bactrim; ciprofloxacin; morphine and related; and codeine.  Linda Hebert  has a past medical history of IBS (irritable bowel syndrome) and Diabetes mellitus. Also  has past surgical history that includes Brain surgery and heart ablation (2006).  ROS As in subjective.  Objective:   Vitals: BP 124/92 mmHg  Pulse 65  Temp(Src) 97.9 F (36.6 C) (Oral)  Resp 16  Ht 5' 5.5" (1.664 m)  Wt 190 lb 9.6 oz (86.456 kg)  BMI 31.22 kg/m2  SpO2 98%  Physical Exam  Constitutional: She is oriented to person, place, and time. She appears well-developed and well-nourished.  Body habitus is obese.  Cardiovascular: Normal rate.   Pulmonary/Chest: Effort  normal.  Musculoskeletal:       Lumbar back: She exhibits decreased range of motion (flexion), tenderness (over areas depicted) and spasm (throughout lumbar para-spinal muscles). She exhibits no swelling, no edema, no deformity and no laceration.       Back:  Neurological: She is alert and oriented to person, place, and time. She has normal reflexes.  Skin: Skin is warm and dry. No rash noted. No erythema. No pallor.   [facility] reading (PRIMARY) by  Dr. Everlene Farrier and PA-Kanye Depree. Lumbar: increased stool burden, otherwise no acute process.  Assessment and Plan :   1. Bilateral low back pain without sciatica 2. Back spasm 3. Constipation, unspecified constipation type - x-ray and physical exam reassuring, will start meloxicam and Flexeril, counseled on dietary modifications, start exercising, will also try docusate instead of MiraLax. Followup in 2 weeks if no improvement in back pain, consider physical therapy or referral to ortho  Jaynee Eagles, PA-C [facility] [phone] 06/23/2014 4:28 PM

## 2014-06-23 NOTE — Patient Instructions (Signed)
- Call me in 2 weeks if your back pain has not improved. We will refer for physical therapy at that point.  Back Pain, Adult Low back pain is very common. About 1 in 5 people have back pain.The cause of low back pain is rarely dangerous. The pain often gets better over time.About half of people with a sudden onset of back pain feel better in just 2 weeks. About 8 in 10 people feel better by 6 weeks.  CAUSES Some common causes of back pain include:  Strain of the muscles or ligaments supporting the spine.  Wear and tear (degeneration) of the spinal discs.  Arthritis.  Direct injury to the back. DIAGNOSIS Most of the time, the direct cause of low back pain is not known.However, back pain can be treated effectively even when the exact cause of the pain is unknown.Answering your caregiver's questions about your overall health and symptoms is one of the most accurate ways to make sure the cause of your pain is not dangerous. If your caregiver needs more information, he or she may order lab work or imaging tests (X-rays or MRIs).However, even if imaging tests show changes in your back, this usually does not require surgery. HOME CARE INSTRUCTIONS For many people, back pain returns.Since low back pain is rarely dangerous, it is often a condition that people can learn to [facility] their own.   Remain active. It is stressful on the back to sit or stand in one place. Do not sit, drive, or stand in one place for more than 30 minutes at a time. Take short walks on level surfaces as soon as pain allows.Try to increase the length of time you walk each day.  Do not stay in bed.Resting more than 1 or 2 days can delay your recovery.  Do not avoid exercise or work.Your body is made to move.It is not dangerous to be active, even though your back may hurt.Your back will likely heal faster if you return to being active before your pain is gone.  Pay attention to your body when you bend and lift. Many  people have less discomfortwhen lifting if they bend their knees, keep the load close to their bodies,and avoid twisting. Often, the most comfortable positions are those that put less stress on your recovering back.  Find a comfortable position to sleep. Use a firm mattress and lie on your side with your knees slightly bent. If you lie on your back, put a pillow under your knees.  Only take over-the-counter or prescription medicines as directed by your caregiver. Over-the-counter medicines to reduce pain and inflammation are often the most helpful.Your caregiver may prescribe muscle relaxant drugs.These medicines help dull your pain so you can more quickly return to your normal activities and healthy exercise.  Put ice on the injured area.  Put ice in a plastic bag.  Place a towel between your skin and the bag.  Leave the ice on for 15-20 minutes, 03-04 times a day for the first 2 to 3 days. After that, ice and heat may be alternated to reduce pain and spasms.  Ask your caregiver about trying back exercises and gentle massage. This may be of some benefit.  Avoid feeling anxious or stressed.Stress increases muscle tension and can worsen back pain.It is important to recognize when you are anxious or stressed and learn ways to manage it.Exercise is a great option. SEEK MEDICAL CARE IF:  You have pain that is not relieved with rest or medicine.  You have pain that does not improve in 1 week.  You have new symptoms.  You are generally not feeling well. SEEK IMMEDIATE MEDICAL CARE IF:   You have pain that radiates from your back into your legs.  You develop new bowel or bladder control problems.  You have unusual weakness or numbness in your arms or legs.  You develop nausea or vomiting.  You develop abdominal pain.  You feel faint. Document Released: 12/31/2004 Document Revised: 07/02/2011 Document Reviewed: 05/04/2013 [facility] Patient Information 2015 [place], [place]. This  information is not intended to replace advice given to you by your health care provider. Make sure you discuss any questions you have with your health care provider.   To help reduce constipation and promote bowel health: 1. Drink at least 64 ounces of water each day 2. Eat plenty of fiber (fruits, vegetables, whole grains, legumes) 3. Be physically active or exercise including walking, jogging, swimming, yoga, etc. 4. For active constipation use a stool softener (docusate) or an osmotic laxative (like Miralax) each day, or as needed  Please pick up Miralax for moderate to severe constipation. Take this once a day for the next 2-3 days. Please also start docusate stool softener, twice a day for at least 1 week. If stools become loose, cut down to once a day for ~1 week. If stools remain loose, cut back to 1/2 pill for ~1 week. You can stop docusate thereafter and resume as needed for constipation.  Constipation Constipation is when a person has fewer than three bowel movements a week, has difficulty having a bowel movement, or has stools that are dry, hard, or larger than normal. As people grow older, constipation is more common. If you try to fix constipation with medicines that make you have a bowel movement (laxatives), the problem may get worse. Long-term laxative use may cause the muscles of the colon to become weak. A low-fiber diet, not taking in enough fluids, and taking certain medicines may make constipation worse.  CAUSES   Certain medicines, such as antidepressants, pain medicine, iron supplements, antacids, and water pills.   Certain diseases, such as diabetes, irritable bowel syndrome (IBS), thyroid disease, or depression.   Not drinking enough water.   Not eating enough fiber-rich foods.   Stress or travel.   Lack of physical activity or exercise.   Ignoring the urge to have a bowel movement.   Using laxatives too much.  SIGNS AND SYMPTOMS   Having fewer than  three bowel movements a week.   Straining to have a bowel movement.   Having stools that are hard, dry, or larger than normal.   Feeling full or bloated.   Pain in the lower abdomen.   Not feeling relief after having a bowel movement.  DIAGNOSIS  Your health care provider will take a medical history and perform a physical exam. Further testing may be done for severe constipation. Some tests may include:  A barium enema X-ray to examine your rectum, colon, and, sometimes, your small intestine.   A sigmoidoscopy to examine your lower colon.   A colonoscopy to examine your entire colon. TREATMENT  Treatment will depend on the severity of your constipation and what is causing it. Some dietary treatments include drinking more fluids and eating more fiber-rich foods. Lifestyle treatments may include regular exercise. If these diet and lifestyle recommendations do not help, your health care provider may recommend taking over-the-counter laxative medicines to help you have bowel movements. Prescription medicines may be  prescribed if over-the-counter medicines do not work.  HOME CARE INSTRUCTIONS   Eat foods that have a lot of fiber, such as fruits, vegetables, whole grains, and beans.  Limit foods high in fat and processed sugars, such as french fries, hamburgers, cookies, candies, and soda.   A fiber supplement may be added to your diet if you cannot get enough fiber from foods.   Drink enough fluids to keep your urine clear or pale yellow.   Exercise regularly or as directed by your health care provider.   Go to the restroom when you have the urge to go. Do not hold it.   Only take over-the-counter or prescription medicines as directed by your health care provider. Do not take other medicines for constipation without talking to your health care provider first.  [place] IF:   You have bright red blood in your stool.   Your constipation lasts for  more than 4 days or gets worse.   You have abdominal or rectal pain.   You have thin, pencil-like stools.   You have unexplained weight loss. MAKE SURE YOU:   Understand these instructions.  Will watch your condition.  Will get help right away if you are not doing well or get worse. Document Released: 09/29/2003 Document Revised: 01/05/2013 Document Reviewed: 10/12/2012 [facility] Patient Information 2015 [place], [place]. This information is not intended to replace advice given to you by your health care provider. Make sure you discuss any questions you have with your health care provider.

## 2014-08-02 ENCOUNTER — Other Ambulatory Visit: Payer: Self-pay | Admitting: Obstetrics and Gynecology

## 2014-08-03 LAB — CYTOLOGY - PAP

## 2014-08-31 ENCOUNTER — Encounter: Payer: Self-pay | Admitting: Neurology

## 2014-08-31 ENCOUNTER — Ambulatory Visit (INDEPENDENT_AMBULATORY_CARE_PROVIDER_SITE_OTHER): Payer: 59 | Admitting: Neurology

## 2014-08-31 VITALS — BP 132/82 | HR 62 | Ht 65.5 in | Wt 189.0 lb

## 2014-08-31 DIAGNOSIS — R413 Other amnesia: Secondary | ICD-10-CM

## 2014-08-31 DIAGNOSIS — D332 Benign neoplasm of brain, unspecified: Secondary | ICD-10-CM

## 2014-08-31 NOTE — Progress Notes (Signed)
PATIENT: Linda Hebert DOB: Aug 12, 1970  Chief Complaint  Patient presents with  . Memory Loss    MMSE 30/30 - 11 animals.  She has had two previous brain surgeries for benign tumors.  Since these surgeries, she has continued to notice a decline in her memory.     HISTORICAL  Linda Hebert is a 44 years old left-handed female, seen in refer by her PCP Dr. Merrilee Seashore, and GYN Dr. Arvella Nigh for evaluation of memory loss, she is currently under neuroncologist care Dr. Leonel Ramsay at [facility]  She had a past medical history of Central neurocytoma, she had late and irregular menstruation cycle at age 83, this lead to brain imaging scan, she had her first right frontal approach intraventricular tumor resection in 1999 at [place]  She had frequent MRI follow-ups by [facility], MRI of the brain with and without contrast November 2010 showed enhancing mass is identified in the anterior aspect of the third ventricle without definite mass effect. It demonstrates peripheral enhancement and measures 1.2 x 1.0 x 1.0 cm, stable. Stable right frontal lobe ventriculostomy tract noted. 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  She also has frequent migraine headaches, is under the care of headache wellness Center, 1-2 times each months, responded very well to Imitrex,  Most recent MRI brain w/wo July 2016: There is a right frontal ventriculostomy/postsurgical tract. There is evidence for prior bifrontal craniotomy. Surgical changes noted along the septum pellucidum. 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 No evidence of new or recurrent mass. No suspicious enhancement. Minimal remaining degree of T2 FLAIR periventricular hyperintensity. No evidence of acute infarction or hemorrhage.  REVIEW OF SYSTEMS: Full 14 system review of systems performed and notable only for weight gain, feeling hot, joint pain, joint swelling, memory loss, headaches, anxiety, not enough sleep, decreased energy   ALLERGIES: Allergies  Allergen Reactions  . Bactrim Nausea And Vomiting  . Ciprofloxacin   . Darvon [Propoxyphene] Nausea And Vomiting    Darvocet  . Morphine And Related   . Codeine Nausea Only    HOME MEDICATIONS: Current Outpatient Prescriptions  Medication Sig Dispense Refill  . calcium carbonate (TUMS - DOSED IN MG ELEMENTAL CALCIUM) 500 MG chewable tablet Chew 1 tablet by mouth daily.    . famotidine (PEPCID AC) 10 MG chewable tablet Chew 10 mg by mouth 2 (two) times daily as needed.    Marland Kitchen levothyroxine (SYNTHROID, LEVOTHROID) 25 MCG tablet Take 25 mcg by mouth daily.    Marland Kitchen LORazepam (ATIVAN) 0.5 MG tablet Take 0.5 mg by mouth every 6 (six) hours as needed for anxiety.    Marland Kitchen  MAGNESIUM-ZINC PO Take 400 mg by mouth once.    Marland Kitchen METOPROLOL SUCCINATE ER PO Take 25 mg by mouth daily.    . mometasone (NASONEX) 50 MCG/ACT nasal spray Place 2 sprays into the nose as needed.    . Multiple Vitamins-Minerals (ADEKS) chewable tablet Chew 2 tablets by mouth daily.    . Omeprazole 20 MG TBEC Take by mouth daily before breakfast.    .  polyethylene glycol powder (GLYCOLAX/MIRALAX) powder Take 17 g by mouth 2 (two) times daily as needed. 3350 g 1  . SUMAtriptan (IMITREX) 20 MG/ACT nasal spray as needed.  1  . SUMAtriptan 6 MG/0.5ML SOAJ as needed.  1  . vitamin C (ASCORBIC ACID) 500 MG tablet Take 500 mg by mouth daily.    . vitamin E 400 UNIT capsule Take 400 Units by mouth daily.    . Zoledronic Acid (RECLAST IV) Inject into the vein.    Marland Kitchen zonisamide (ZONEGRAN) 100 MG capsule Take 100 mg by mouth 2 (two) times daily.       PAST MEDICAL HISTORY: Past Medical History  Diagnosis Date  . IBS (irritable bowel syndrome)   . Diabetes mellitus   . Brain tumor (benign)   . Migraine   . Memory loss     PAST SURGICAL HISTORY: Past Surgical History  Procedure Laterality Date  . Brain surgery      x 2  . Heart ablation  2006  . Liver biopsy      FAMILY HISTORY: Family History  Problem Relation Age of Onset  . Hypertension Father   . Hypertension Mother   . Parkinson's disease Maternal Grandmother     SOCIAL HISTORY:  Social History   Social History  . Marital Status: Married    Spouse Name: N/A  . Number of Children: 0  . Years of Education: 16   Occupational History  . Out of work on disability    Social History Main Topics  . Smoking status: Never Smoker   . Smokeless tobacco: Never Used  . Alcohol Use: 0.0 oz/week    0 Standard drinks or equivalent per week     Comment: occasional - 1/2 drink per week or less  . Drug Use: No  . Sexual Activity: Not on file   Other Topics Concern  . Not on file   Social History Narrative   Lives at home with her husband.   Left-handed.   Occasional caffeine use.        PHYSICAL EXAM   Filed Vitals:   08/31/14 1447  BP: 132/82  Pulse: 62  Height: 5' 5.5" (1.664 m)  Weight: 189 lb (85.73 kg)    Not recorded      Body mass index is 30.96 kg/(m^2).  PHYSICAL EXAMNIATION:  Gen: NAD, conversant, well nourised, obese, well groomed                      Cardiovascular: Regular rate rhythm, no peripheral edema, warm, nontender. Eyes: Conjunctivae clear without exudates or hemorrhage Neck: Supple, no carotid bruise. Pulmonary: Clear to auscultation bilaterally   NEUROLOGICAL EXAM:  MENTAL STATUS: Speech:    Speech is normal; fluent and spontaneous with normal comprehension.  Cognition: MMSE 30/30, animal naming 11     Orientation to time, place and person     Normal recent and remote memory     Normal Attention span and concentration     Normal Language, naming, repeating,spontaneous speech     [employer]  of knowledge   CRANIAL NERVES: CN II: Visual fields are full to confrontation. Fundoscopic exam is normal with sharp discs and no vascular changes. Pupils are round equal and briskly reactive to light. CN III, IV, VI: extraocular movement are normal. No ptosis. CN V: Facial sensation is intact to pinprick in all 3 divisions bilaterally. Corneal responses are intact.  CN VII: Face is symmetric with normal eye closure and smile. CN VIII: Hearing is normal to rubbing fingers CN IX, X: Palate elevates symmetrically. Phonation is normal. CN XI: Head turning and shoulder shrug are intact CN XII: Tongue is midline with normal movements and no atrophy.  MOTOR: There is no pronator drift of out-stretched arms. Muscle bulk and tone are normal. Muscle strength is normal.  REFLEXES: Reflexes are 2+ and symmetric at the biceps, triceps, knees, and ankles. Plantar responses are flexor.  SENSORY: Intact to light touch, pinprick, position sense, and vibration sense are intact in fingers and toes.  COORDINATION: Rapid alternating movements and fine finger movements are intact. There is no dysmetria on finger-to-nose and heel-knee-shin.    GAIT/STANCE: Posture is normal. Gait is steady with normal steps, base, arm swing, and turning. Heel and toe walking are normal. Tandem gait is normal.  Romberg is absent.   DIAGNOSTIC DATA (LABS,  IMAGING, TESTING) - I reviewed patient records, labs, notes, testing and imaging myself where available.   ASSESSMENT AND PLAN  MYLEIGH AMARA is a 44 y.o. female   memory loss history of central neurocytoma, status post bilateral frontal approach subtotal resection, followed by radiation therapy   Refer her to neuropsychiatric evaluation  Laboratory evaluations for treatable etiology   Return to clinic in 3 months  Marcial Pacas, M.D. Ph.D.  [facility] [address], [place],  [address] Ph: [phone] Fax: [phone]  CC: Merrilee Seashore, MD. Dr. Arvella Nigh

## 2014-09-01 ENCOUNTER — Other Ambulatory Visit: Payer: Self-pay | Admitting: *Deleted

## 2014-09-01 ENCOUNTER — Telehealth: Payer: Self-pay | Admitting: Neurology

## 2014-09-01 DIAGNOSIS — D332 Benign neoplasm of brain, unspecified: Secondary | ICD-10-CM

## 2014-09-01 DIAGNOSIS — R413 Other amnesia: Secondary | ICD-10-CM

## 2014-09-01 LAB — CBC WITH DIFFERENTIAL
BASOS ABS: 0.1 10*3/uL (ref 0.0–0.2)
Basos: 1 %
EOS (ABSOLUTE): 0.3 10*3/uL (ref 0.0–0.4)
Eos: 3 %
Hematocrit: 37.9 % (ref 34.0–46.6)
Hemoglobin: 13.1 g/dL (ref 11.1–15.9)
IMMATURE GRANS (ABS): 0 10*3/uL (ref 0.0–0.1)
IMMATURE GRANULOCYTES: 0 %
LYMPHS: 39 %
Lymphocytes Absolute: 3.4 10*3/uL — ABNORMAL HIGH (ref 0.7–3.1)
MCH: 30 pg (ref 26.6–33.0)
MCHC: 34.6 g/dL (ref 31.5–35.7)
MCV: 87 fL (ref 79–97)
MONOS ABS: 0.5 10*3/uL (ref 0.1–0.9)
Monocytes: 6 %
NEUTROS PCT: 51 %
Neutrophils Absolute: 4.4 10*3/uL (ref 1.4–7.0)
RBC: 4.36 x10E6/uL (ref 3.77–5.28)
RDW: 13.9 % (ref 12.3–15.4)
WBC: 8.7 10*3/uL (ref 3.4–10.8)

## 2014-09-01 LAB — THYROID PANEL WITH TSH
FREE THYROXINE INDEX: 1.4 (ref 1.2–4.9)
T3 Uptake Ratio: 20 % — ABNORMAL LOW (ref 24–39)
T4, Total: 6.9 ug/dL (ref 4.5–12.0)
TSH: 1.86 u[IU]/mL (ref 0.450–4.500)

## 2014-09-01 LAB — COMPREHENSIVE METABOLIC PANEL
ALT: 32 IU/L (ref 0–32)
AST: 21 IU/L (ref 0–40)
Albumin/Globulin Ratio: 1.8 (ref 1.1–2.5)
Albumin: 4.5 g/dL (ref 3.5–5.5)
Alkaline Phosphatase: 84 IU/L (ref 39–117)
BUN/Creatinine Ratio: 17 (ref 9–23)
BUN: 16 mg/dL (ref 6–24)
Bilirubin Total: 0.2 mg/dL (ref 0.0–1.2)
CALCIUM: 9.4 mg/dL (ref 8.7–10.2)
CHLORIDE: 102 mmol/L (ref 97–108)
CO2: 22 mmol/L (ref 18–29)
Creatinine, Ser: 0.92 mg/dL (ref 0.57–1.00)
GFR, EST AFRICAN AMERICAN: 89 mL/min/{1.73_m2} (ref >59)
GFR, EST NON AFRICAN AMERICAN: 77 mL/min/{1.73_m2} (ref >59)
GLUCOSE: 100 mg/dL — AB (ref 65–99)
Globulin, Total: 2.5 g/dL (ref 1.5–4.5)
POTASSIUM: 4.1 mmol/L (ref 3.5–5.2)
Sodium: 142 mmol/L (ref 134–144)
TOTAL PROTEIN: 7 g/dL (ref 6.0–8.5)

## 2014-09-01 LAB — SEDIMENTATION RATE: SED RATE: 20 mm/h (ref 0–32)

## 2014-09-01 LAB — ANA W/REFLEX IF POSITIVE
ANA: POSITIVE — AB
Centromere Ab Screen: <0.2 AI (ref 0.0–0.9)
Chromatin Ab SerPl-aCnc: <0.2 AI (ref 0.0–0.9)
ENA RNP Ab: <0.2 AI (ref 0.0–0.9)
ENA SM Ab Ser-aCnc: <0.2 AI (ref 0.0–0.9)
ENA SSA (RO) AB: 2.7 AI — AB (ref 0.0–0.9)
Scleroderma SCL-70: <0.2 AI (ref 0.0–0.9)
dsDNA Ab: 1 IU/mL (ref 0–9)

## 2014-09-01 LAB — C-REACTIVE PROTEIN: CRP: 5.4 mg/L — ABNORMAL HIGH (ref 0.0–4.9)

## 2014-09-01 LAB — RPR: RPR: NONREACTIVE

## 2014-09-01 LAB — VITAMIN B12: Vitamin B-12: 375 pg/mL (ref 211–946)

## 2014-09-01 NOTE — Telephone Encounter (Signed)
Spoke to patient - she is aware of lab results. 

## 2014-09-01 NOTE — Telephone Encounter (Signed)
Please call patient, laboratory showed positive ANA, mild elevated C reactive protein, of unknown clinical significance, I have faxed laboratory results to her primary care physician, may consider repeat test at her next PCP visit  Rest of the laboratory CMP, CBC, thyroid function tests, ESR, B12, RPR showed no significant abnormality

## 2014-09-29 ENCOUNTER — Telehealth: Payer: Self-pay | Admitting: Neurology

## 2014-09-29 NOTE — Telephone Encounter (Signed)
Patient called re: Dr Krista Blue referring to Neuro Psychologist. Patient states she has yet to hear from anyone. Would like for someone to check on this.

## 2014-09-29 NOTE — Telephone Encounter (Signed)
Left patient a message letting her know to expect a call from Dr. Monico Hoar office.  She is aware the appt will be sometime in November.

## 2014-09-30 ENCOUNTER — Telehealth: Payer: Self-pay | Admitting: *Deleted

## 2014-09-30 NOTE — Telephone Encounter (Signed)
Form,[employer] went to [facility] 09/30/14.

## 2014-10-04 NOTE — Telephone Encounter (Signed)
Patient returned our call. She missed a call from [place] today. I advised a message was left 09/29/14 letting her know to expect a call from Dr. Monico Hoar office. Patient says she never received this message.

## 2014-10-04 NOTE — Telephone Encounter (Signed)
Spoke to [place] concerning her long term disability paperwork.  Dr. Lynden Ang at [facility] initially requested her disability.  Her records will be provided to [facility] from her 08/31/14 office visit with Dr. Krista Blue per their request and with her signed medical release consent form.  [employer] will need to request her restrictions and limitations from Dr. Leonel Ramsay.

## 2014-10-11 ENCOUNTER — Other Ambulatory Visit: Payer: Self-pay | Admitting: Obstetrics and Gynecology

## 2014-10-11 ENCOUNTER — Telehealth: Payer: Self-pay | Admitting: Neurology

## 2014-10-11 NOTE — Telephone Encounter (Signed)
Spoke to [place] - rescheduled her appt to allow time to test results to be completed by Dr. Valentina Shaggy.

## 2014-10-11 NOTE — Telephone Encounter (Signed)
Patient called has appointment with Dr. Krista Blue on 11/30/14 patient thought Dr. Krista Blue didn't want to see her until after she sees Dr. Valentina Shaggy (12/02/14). Please advise if patient is supposed to reschedule 11/30/14 and if so, how soon after Dr. Valentina Shaggy?

## 2014-11-30 ENCOUNTER — Ambulatory Visit: Payer: 59 | Admitting: Neurology

## 2014-12-02 ENCOUNTER — Encounter: Payer: Self-pay | Admitting: Psychology

## 2014-12-02 ENCOUNTER — Ambulatory Visit: Payer: 59 | Attending: Psychology | Admitting: Psychology

## 2014-12-02 DIAGNOSIS — F09 Unspecified mental disorder due to known physiological condition: Secondary | ICD-10-CM | POA: Diagnosis not present

## 2014-12-02 NOTE — Progress Notes (Signed)
[facility]  [address]   Telephone [phone] [address] Fax [phone] [place], [place] [address]  Initial Contact Note  Name:  Linda Hebert Date of Birth; [birth year] MRN:  [id number] Date:  12/02/2014  CHAI COTRONE is an 44 y.o. female who was referred for neuropsychological evaluation by Marcial Pacas MD due to her report of progressive cognitive decline subsequent to resection of an intraventricular neurocytoma in 2010.   A total of 5 hours was spent today reviewing medical records, interviewing (CPT [phone]) [place] and administering and scoring neurocognitive tests (CPT [id number] & [phone]).  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   A full report will follow once the planned testing has been completed. Her next appointment is scheduled for 12/12/14.   Jamey Ripa, Ph.D Licensed Psychologist 12/02/2014

## 2014-12-12 ENCOUNTER — Encounter: Payer: Self-pay | Admitting: Psychology

## 2014-12-12 ENCOUNTER — Ambulatory Visit (INDEPENDENT_AMBULATORY_CARE_PROVIDER_SITE_OTHER): Payer: 59 | Admitting: Psychology

## 2014-12-12 DIAGNOSIS — F09 Unspecified mental disorder due to known physiological condition: Secondary | ICD-10-CM | POA: Diagnosis not present

## 2014-12-12 NOTE — Progress Notes (Signed)
[facility]  [address]   Telephone [phone] [address] Fax [phone] [place], [place] [address]  Follow-Up Contact Note  Name:  Linda Hebert Date of Birth: Feb 20, 1970 MRN:  [id number] Date:  12/12/2014  Linda Hebert is an 44 y.o. female who was referred for neuropsychological evaluation by Marcial Pacas, MD due to problems with memory.   Neuropsychological evaluation was completed today. A total of 4 hours was spent today administering and scoring neurocognitive tests (CPT [id number] & [phone]) as well as preparing a written report.   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   The conclusions and recommendations from this evaluation will be discussed with Linda Hebert on 12/19/14. A full report will follow.   Jamey Ripa, Ph.D Licensed Psychologist 12/12/2014

## 2014-12-19 ENCOUNTER — Ambulatory Visit: Payer: 59 | Attending: Psychology | Admitting: Psychology

## 2014-12-19 ENCOUNTER — Encounter: Payer: Self-pay | Admitting: Psychology

## 2014-12-19 DIAGNOSIS — F09 Unspecified mental disorder due to known physiological condition: Secondary | ICD-10-CM | POA: Insufficient documentation

## 2014-12-19 NOTE — Progress Notes (Addendum)
[facility]  [address]   Telephone [phone] [address] Fax [phone] [place], [place] [address]   [place]* This report should not be released without the consent of the client  Name:   Linda Hebert  Date of Birth:  [birth year] [facility] MR#:  [id number] Dates of Evaluation: 12/02/14, 12/12/14 & 12/19/14  Reason for Referral Linda Hebert is a 44 year old left-handed woman who was referred for neuropsychological evaluation by Linda Pacas, MD of [facility] to assess her cognitive and emotional functioning. 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 The brain tumor was initially discovered in 1999 and classified as benign per biopsy. A follow-up brain MRI scan in November 2010 showed an enhancing mass in the anterior aspect of the third ventricle. She underwent neurosurgery for tumor resection via bilateral frontal approach in December 2010 followed by a course of radiation completed on 03/14/09. Her most recent brain MRI scan on 07/29/14 was noted as stable without evidence of recurrent mass or acute findings. 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  Sources of Information Electronic medical records from the [facility] were reviewed. Linda Hebert, her husband Mr. Linda Hebert, and her longtime friend, Ms. Linda Hebert, were interviewed.    Chief Complaints & Current Status Linda Hebert reported that she began to experience cognitive difficulties, especially for concentration, memory and word-finding, soon after her neurosurgery in 2010. She stated her belief that  her cognitive functioning has gradually worsened over the past six years. She gave recent examples of having difficulty recalling names, details from conversations that occurred a few hours ago, a recent movie she saw, having dined at a certain restaurant and the sequence of steps involved in a work procedure despite frequent review. 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 She has been on disability from work since August 2016. She reported having no major difficulties performing her instrumental activities of daily living, such as meal preparation, driving and financial management.  With regards to her emotional functioning, she acknowledged experiencing transient frustration and lowered self-esteem in reaction to her awareness of having cognitive difficulty but otherwise denied serious emotional difficulties. She specifically denied experiencing persisting sadness, apathy, mood instability, undue anxiety, suicidal ideation, hallucinations or delusional ideas.   A review of her somatic and physical functioning was notable for her report of mild foot pain due to plantar fasciitis, occasional migraine headaches and difficulty with sleep maintenance most nights. She did not report problems with balance, gait, limb strength, dizziness, daytime alertness, vision, hearing, speech, swallowing, appetite, taste or smell.   Her husband agreed with his wife that she began to display cognitive difficulties soon after her brain surgery in 2010. He reported that the frequency of her memory errors has steadily increased over the past six years. He gave recent examples of her repeating questions or statements, forgetting recent events, having difficulty multitasking and using imprecise words for objects (e.g., calling a television remote control a "loudy" or a "switchy"). Both her  husband and her friend of more than thirty years described her as less patient  and more easily frustrated, usually triggered when she becomes aware of forgetting something or when something does not go as well as she expects. Sometimes she will speak in a "harsh" manner to family and friends without being aware of doing so. He reported that she has not appeared persistently sad, lacking in motivation, disinterested or expressed themes of depression. He has not observed her to have exhibited confusion, problems with social comportment, impulsivity, poor judgment or unsafe behavior.   Background In addition to her central neurocytoma, her past medical history was notable for cardiac dysrhythmia, Diabetes (steroid induced; diet-controlled), hypothyroidism, irritable bowel syndrome and migraine headaches (usually one to two headaches per month). In addition to brain surgery, and she underwent cardiac ablation in 2009.   Her current medications include famotidine, levothyroxine, metoprolol, omeprazole, sumatriptan, Zoledronic Acid and zonisamide.  She denied history of head injury, seizure activity, neurological infection or exposure to neurotoxic substances. She reported occasional use of alcohol (i.e., glass of wine) without history of abuse. She denied use of illicit drugs or tobacco products.   She reported no history of emotional difficulties, being a victim of abuse, use of psychiatric medication or mental health contacts.   Her family medical history was notable for her maternal grandmother with Parkinson's disease. There was no report of family history of psychiatric disorder.  Linda Hebert lives with her husband of nineteen years. They do not have children.  She reported that she has been employed by a bank for many years, eventually attaining the position of Engineer, water in 2007.  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 Other than her belief that she was a "slow reader", she did not report any history of school-based attentional or learning problems. She denied ever being retained in grade or receiving special education services.   She reported no history of legal charges or personal injury litigation.  Evaluation Procedures In addition to review of medical records and interviews, the following tests or questionnaires were administered:  Animal Naming Test [facility] Depression Inventory-II Boston Naming Test Controlled Oral Word Association Test Finger Tapping Test Hooper Test of Visual Organization Judgment of Line Orientation Multidimensional Health Profile-Psychosocial Functioning  Rey 15-Item Memory Test Rey Complex Figure: copy Stroop Color and Word Test Test of Memory Malingering Trail Making A & B Wechsler Adult Intelligence Scale-IV Wechsler Memory Scale-IV  [place] Card Sorting Test  Test Results & Interpretation Validity & Interpretive Considerations She was cooperative throughout the testing process. She did not display signs of physical or emotional distress. She described herself as fully alert. She did not report or display problems with vision (she wore her eyeglasses), hearing or motor control. She had no apparent problems understanding task instructions. She appeared to maintain attention and persist to task. She did not display signs of impulsivity or perseveration. She appeared to consistently expend adequate effort. Her performances on symptom validity measures corroborated this belief. On a test of validity (Test of Memory Malingering) that required immediate recognition of drawings of common objects from a set of two possibilities, she correctly recognized all 50 drawings on both trials. Likewise, on another validity test (Rey 15-Item Memory Test) that required her to immediately draw fifteen symbols from memory that can be easily accomplished due to the redundancy amongst the  items, she drew all 15 items. In sum, it was concluded that the current test results represented a valid measure of her cognitive functioning.  Her  baseline intellectual potential was estimated to fall within the Average range based on demographic factors. Her test scores were corrected to reflect norms for her age and, whenever possible, her gender and educational level (i.e., 16 years). A listing of her test scores can be found at the end of this report.  Intellectual Functioning Her global intellectual functioning fell within the Borderline range on the Wechsler Adult Intelligence Scale-IV (WAIS-IV) as she obtained a WAIS-IV Full Scale IQ of 20, which corresponded to the 6th percentile relative to same-aged peers. This result represented a substantial decline from her estimated Average pre-morbid intellectual level. Indeed, there were indications of higher intellectual potential based on her average scores on measures of word knowledge (Vocabulary) and nonverbal reasoning (Matrix Reasoning) that are considered to be highly correlated with general intelligence. Analysis of WAIS-IV Index scores indicated that her Verbal Comprehension Index, a composite measure of her fund of verbal knowledge and verbal reasoning, fell within the Low Average range at the 19th percentile. This score could not be considered a meaningful representation of her overall verbal ability due to a high degree of subtest scatter. More specifically, her abilities to define words (Vocabulary) and express abstract verbal concepts (Similarities) fell within the Average range while a measure of her fund of factual knowledge about the world (Information) fell within the Borderline range. Such a weakness is typically seen in persons with low school achievement, limited cultural opportunities and/or difficulties with retrieval from long-term memory. Her Perceptual Reasoning Index, a measure of her ability to perceive, organize and/or  conceptualize visual material, fell within the Borderline range at the 8th percentile. Within this index, she demonstrated subnormal performances on tests of visual-spatial organization that required assembly of two-dimensional block designs from models Garment/textile technologist) or mental reconstruction of designs from their constituent parts Quarry manager). Her Working Memory Index, a measure of her ability to encode and manipulate information in temporary auditory memory as assessed by her abilities to mentally rearrange digit sequences and solve mental calculations, fell within the Borderline range at the 3rd percentile. Finally, her Processing Speed Index, a measure of her speed and accuracy on timed tests that required the processing of simple or routine visual information, fell within the Low Average range at the 14th percentile.    Attention & Executive Functions Visual processing speed was lower than expected. As noted above, she scored within the Low Average range on WAIS-IV tests of processing speed that required transcription of symbols to match digits using a key (WAIS-IV Coding) or discrimination of similarities and differences amongst sets of geometric symbols (WAIS-IV Symbol Search). Her speed on a task that required visual scanning and simple sequencing (Trails A) fell within the Borderline range.   Her performances on tests of attentional capacity/working memory in the auditory channel varied from Low Average to Borderline range on tests that required her to mentally rearrange digit sequences in reverse or ascending sequence (WAIS-IV Digit Span) or solve mental calculations (WAIS-IV Arithmetic). Her performances on tests of visual working memory that required immediate recognition of symbols in left to right order (Wechsler Memory Scale-IV (WMS-IV) Symbol Span) or immediate recall of spatial locations within a grid (WMS-IV Spatial Addition) were within the Average and Low Average range, respectively.  Overall, her working memory capacity for Banker Working Memory Index) was somewhat better than expected given her auditory working memory capacity (WAIS-IV Working Memory Index).  Her performances on nonverbal measures of executive function that required complex attention, mental flexibility and/or  logical reasoning were within normal expectations. On a test of complex visual sequencing and set shifting that required the maintenance of an alternating and ascending number to letter sequence (Trails B), her speed to completion was below average though she did not deviate from the correct sequence. Her slower than average speed on Trails B was likely due to slowed visual scanning speed (as evident on Trails A) rather than reflecting a problem with cognitive set shifting. She demonstrated strong ability to selectively allocate attention while inhibiting competing responses on a test (Stroop Color and Word Test) that required her to name the print color of a word while simultaneously ignoring the conflicting word. She performed within normal expectations on a test of nonverbal problem-solving and conceptual flexibility [employer]) as she efficiently inferred the initial sorting principle, completed all six categories, maintained set and effectively shifted to a new strategy after receiving feedback that the prior strategy was no longer correct.  Learning & Memory A composite measure of her immediate memory for auditory and visual information (WMS-IV Immediate Memory Index) fell within the Average range at the 34th percentile. All of her immediate memory subtest scores fell within the Average range. A measure of her delayed memory (WMS-IV Delayed Memory Index) likewise fell within the Average range at the 32nd percentile. Overall, her Delayed Memory Index was as expected given her Immediate Memory Index, which indicated that her ability to store initially learned  information was normal. At a finer level of analysis, however, she showed an isolated weakness for delayed story recall  as her ability to recall story details after the delay (both spontaneously and via a recognition format) declined to the Low Average range from an initial average level of acquisition (i.e., savings= 56%). Finally, her abilities to learn and retain orally-presented information (Auditory Memory Index) and visual information (Visual Memory Index) were commensurate with expectations given her verbal (WAIS-IV Verbal Comprehension Index) and nonverbal intellectual functioning (WAIS-IV Perceptual Reasoning Index), respectively.  Language No qualitative problems were evident for speech articulation, prosody, word finding, word selection, message coherence or language comprehension. In contrast, testing indicated a deficit for expressive language, specifically for verbal retrieval from long-term semantic lexicon. Her confrontation naming [employer]) was well within the impaired range. For example, she misnamed drawings of a dart, beaver, rhinoceros and hammock. All of her naming errors were within the same category as the target word, which was indicative of a semantic rather than a visual-perceptual problem. Measures of verbal productivity that required generation of words to designated letters under time pressure (Controlled Oral Word Association Test) or naming members of a category (Animal Naming Test) were well within the impaired range.  Her fund of word knowledge (WAIS-IV Vocabulary) fell within the Average range.   Visual-Perceptual-Organizational Skills  There were no signs of spatial inattention or scanning defect. As noted above, her performance on a test that required visual scanning (Trails A) was within the Borderline range. While basic visual recognition was intact, her ability to estimate angular relationships between line segments (Judgment of Line Orientation) was within  the Borderline impaired range. Moreover, she demonstrated mild impairment of visual-spatial organization based on her subnormal performances on tests that required assembly of two-dimensional block designs from models Product manager), mental reconstruction of designs from their constituent parts (WAIS-IV Visual Puzzles) or identification of drawings of fragmented objects Chartered loss adjuster). Her copy of a spatially-complex design (Rey Complex Figure) was without error.   Fine Motor  She demonstrated consistent left hand preference. No problems with gross motor coordination were observed. Her fine motor speed (Finger Tapping Test) was within the Average range for her left hand and the High Average range for her right hand. Her dominant (left) hand was not as fast relative to her right hand as expected though this discrepancy was not considered to be clinically significant.    Emotional Status On the Multidimensional Health Profile-Psychosocial Functioning, she scored within the normal or typical range on self-report measures of the number of stressful life events and perceived life stress experienced within the past year; coping skills; social support; interactions with others; and global psychological distress within the past two weeks. Her only above average score was on a measure of perceived cognitive problems. She rated herself as feeling mildly dissatisfied with her life as a whole.  On the Beck Depression Inventory-II, her score of 13 was within the minimal range. No symptom was endorsed beyond 1 on a 0 - 3 scale. Of note, she did not report having had any suicidal thoughts within the past two weeks.   Summary & Conclusions Deriana Gohlke is a 44 year-old woman who reported that she began to experience cognitive difficulties, primarily for concentration, memory and word-finding, subsequent to undergoing neurosurgery in 2010 to resect an intraventricular central  neurocytoma. She stated her belief that her cognitive functioning has gradually worsened over the past six years. Her husband concurred with her report of cognitive difficulties.  Neuropsychological testing identified mild impairments in the areas of auditory working memory, visuospatial organization and expressive language (i.e., confrontation naming and verbal fluency). In addition, her performances within the Low Average to Borderline ranges on measures of focused visual attention/processing speed likely represented a decline from her pre-morbid level. Taken together, these deficits resulted in a substantial decline in her global intellectual functioning to the Borderline range (WAIS-IV Full Scale IQ = 77; 6th percentile) currently as compared to her estimated Average pre-morbid level. On the positive side, her learning and memory abilities were overall within normal expectations although she demonstrated somewhat lower than expected delayed recall of story length information. Finally, other neuropsychological abilities that were within normal expectations included abstract reasoning and nonverbal executive functions of selective attention, set shifting, novel problem-solving.   From a functional perspective, her neuropsychological profile would predict that she might be slower than the average person to carry out simple clerical-type tasks that require focused visual attention, struggle to hold on to and manipulate auditory information in short-term memory in order to use it, be prone to misjudge angular relationships, have difficulty assembling or organizing parts or visual details into a meaningful whole, fail to precisely name objects and have difficulty finding words while speaking.   With regards to her emotional functioning, she acknowledged experiencing transient frustration and lowered self-esteem in reaction to her awareness of cognitive difficulty but otherwise did not report clinically  significant symptoms of emotional distress or problems with psychosocial adjustment. Both her husband and her longtime friend described her as having become less patient and more easily frustrated over the past few years, most often triggered by her awareness of having made a cognitive error. There was no report of any other signs of emotional or behavioral disturbance.  There were no non-neurological factors identified that might be impacting her cognitive functioning. Her and her husband's belief that her cognitive difficulties have progressively worsened over the past six years could not be assessed without the availability of prior baseline psychometric data.  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 Ms. Compton already uses a commercially available computer-based cognitive rehabilitation program. She was advised to spend more time on the modules or tasks specific to visual-spatial processing.  2. If in the future she or someone close to her should perceive a substantial decline in her cognitive functioning, then a repeat neuropsychological evaluation should be considered using the current results as a baseline.  The results and conclusions from this evaluation were discussed with Ms. Illes and her mother on 12/19/14.    I have appreciated the opportunity to evaluate Ms. Dusza. Please feel free to contact me with any comments or questions.      ______________________ Jamey Ripa, Ph.D Licensed Psychologist       Copies to: Linda Pacas, MD [facility]  Ms. Saoirse Amara (per her request)  [place]       ADDENDUM-NEUROPSYCHOLOGICAL TEST RESULTS  Her test scores were corrected to reflect norms for her age and, whenever possible, her gender and educational level (i.e., 16 years).  Animal Naming Test Score= 11 <1st (adjusted for age, gender and educational level)     Boston Naming Test Score=44/60 <1st (adjusted for age, gender and educational level)    Controlled Oral Word Association Test Score=  19 words/0 repetition  1st (adjusted for age, gender and educational level)    Finger Tapping L: 51.33 73rd (adjusted for age, gender and educational level)  R:  50.33 88th  (adjusted for age, gender and educational level)    Land Adjusted score= 21.5/30 Borderline    Judgment of Line Orientation Score= 20/30 Borderline defective    Rey Complex Figure: copy       Score= 36/36  Normal    Rey 15-Item Memory Test Score= 15/15 Normal/passed    Stroop Color Word Test  Score residual % (adjusted for age and educational level)  Word 112   4 63rd    Color  90 11 82nd    Color-Word  60 15 94th       Test of Memory Malingering Trial 1= 50/50   Trial 2= 50/50  Passed     Trails A Score= 38s  8th (adjusted for age, gender and educational level)  Trails B Score= 67s  18th (adjusted for age, gender and educational level)    Wechsler Adult Intelligence Scale-IV   Scale     composite score percentile rank  Verbal Comprehension   87 19th    Perceptual Reasoning   79   8th     Working Memory   71   3rd     Processing Speed   84  14th     Full Scale IQ   77   6th      Subtest Scaled Score Percentile  Block Design   5   5th   Similarities   8 25th   Digit Span  Forward                Backward                Sequencing   5   5   7   8    5th              5th  16th    25th    Matrix Reasoning  10 50th     Vocabulary  10 50th    Arithmetic   5   5th  Symbol Search   7 16th    Visual Puzzles   4   2nd     Information   5   5th   Coding     7  16th      Wechsler Memory Scale-IV  Index Index Score Percentile  Immediate Memory   94 34th      Auditory Memory   94 34th       Visual Memory   95 37th    Delayed Memory   62 32nd       Visual Working Memory   30 27th         [employer] Test  Total errors=  17   37th (adjusted for age and education)  Perseverative errors=    8   37th      Categories=    6 >16th     Trials to first category=  12 >16th    Failure to maintain set   0 >16th   Learning to learn=   0 >16th

## 2014-12-22 ENCOUNTER — Ambulatory Visit (INDEPENDENT_AMBULATORY_CARE_PROVIDER_SITE_OTHER): Payer: 59 | Admitting: Neurology

## 2014-12-22 ENCOUNTER — Encounter: Payer: Self-pay | Admitting: Neurology

## 2014-12-22 VITALS — BP 133/95 | HR 74 | Ht 65.5 in | Wt 193.0 lb

## 2014-12-22 DIAGNOSIS — R413 Other amnesia: Secondary | ICD-10-CM

## 2014-12-22 DIAGNOSIS — Z9889 Other specified postprocedural states: Secondary | ICD-10-CM

## 2014-12-22 NOTE — Progress Notes (Signed)
Chief Complaint  Patient presents with  . Memory Loss    MMSE 30/30 - 14 animals. Reports still having problems with her memory.  She is now out of work and receiving long-term disability.  She would like to discuss her report from Dr. Valentina Shaggy.      PATIENT: Linda Hebert DOB: 02/16/1970  Chief Complaint  Patient presents with  . Memory Loss    MMSE 30/30 - 14 animals. Reports still having problems with her memory.  She is now out of work and receiving long-term disability.  She would like to discuss her report from Dr. Valentina Shaggy.     HISTORICAL  Linda Hebert is a 44 years old left-handed female, seen in refer by her PCP Dr. Merrilee Seashore, and GYN Dr. Arvella Nigh for evaluation of memory loss, she is currently under neuroncologist care Dr. Leonel Ramsay at [facility]  She had a past medical history of Central neurocytoma, she had late and irregular menstruation cycle at age 61, this lead to brain imaging scan, she had her first right frontal approach intraventricular tumor resection in 1999 at [place]  She had frequent MRI follow-ups by [place], MRI of the brain with and without contrast November 2010 showed enhancing mass is identified in the anterior aspect of the third ventricle without definite mass effect. It demonstrates peripheral enhancement and measures 1.2 x 1.0 x 1.0 cm, stable. Stable right frontal lobe ventriculostomy tract noted. 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  She also has frequent migraine headaches, is under the care of headache wellness Center, 1-2 times each months, responded very well to Imitrex,  Most recent MRI brain w/wo July 2016: There is a right frontal ventriculostomy/postsurgical tract. There is evidence for prior bifrontal craniotomy. Surgical changes noted along the septum pellucidum. 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 No evidence of new or recurrent mass. No suspicious enhancement. Minimal remaining degree of T2 FLAIR periventricular hyperintensity. No evidence of acute infarction or hemorrhage.  UPDATE Dec 22 2014: She used to work as Surveyor, mining, with progressive memory loss especially since her craniotomy and radiation therapy, she is now on long-term disability, this was initiated from [place], Dr. Lynden Ang, radiation oncologist, who treated her with cranial radiation therapy, she also helps her husband who has his own business.  She was evaluated by Dr. Valentina Shaggy in December 2016, Neuropsychological testing identified mild impairments in the areas of auditory working memory, visuospatial organization and expressive language. her performances within the Low Average to Borderline ranges on measures of focused visual attention/processing speed likely represented a decline from her pre-morbid level. These deficits resulted in a  substantial decline in her global intellectual functioning to the Borderline range (WAIS-IV Full Scale IQ = 77; 6th percentile) currently as compared to her estimated Average pre-morbid level.    From a functional perspective,  her neuropsychological profile would predict that she might be slower than the average person to carry out simple clerical-type tasks that require focused visual attention, struggle to hold on to and manipulate auditory information in short-term memory in order to use it, be prone to misjudge angular relationships, have difficulty assembling or organizing parts or visual details into a meaningful whole, fail to precisely name objects and have difficulty finding words while speaking.   We also reviewed laboratory evaluations, normal B12, TSH,ANA   REVIEW OF SYSTEMS: Full 14 system review of systems performed and notable only for cough, memory loss, headaches, environmental allergy ALLERGIES: Allergies  Allergen Reactions  . Bactrim Nausea And Vomiting  . Ciprofloxacin   . Darvon [Propoxyphene] Nausea And Vomiting    Darvocet  . Morphine And Related   . Codeine Nausea Only    HOME MEDICATIONS: Current Outpatient Prescriptions  Medication Sig Dispense Refill  . calcium carbonate (TUMS - DOSED IN MG ELEMENTAL CALCIUM) 500 MG chewable tablet Chew 1 tablet by mouth daily.    . famotidine (PEPCID AC) 10 MG chewable tablet Chew 10 mg by mouth 2 (two) times daily as needed.    Marland Kitchen levothyroxine (SYNTHROID, LEVOTHROID) 25 MCG tablet Take 25 mcg by mouth daily.    Marland Kitchen LORazepam (ATIVAN) 0.5 MG tablet Take 0.5 mg by mouth every 6 (six) hours as needed for anxiety.    Marland Kitchen MAGNESIUM-ZINC PO Take 400 mg by mouth once.    Marland Kitchen METOPROLOL SUCCINATE ER PO Take 25 mg by mouth daily.    . mometasone (NASONEX) 50 MCG/ACT nasal spray Place 2 sprays into the nose as needed.    . Multiple Vitamins-Minerals (ADEKS) chewable tablet Chew 2 tablets by mouth daily.    . Omeprazole 20 MG TBEC Take by mouth daily before breakfast.    . polyethylene glycol powder (GLYCOLAX/MIRALAX) powder Take 17 g by mouth 2 (two) times daily as needed. 3350 g 1  . SUMAtriptan (IMITREX) 20 MG/ACT nasal spray as needed.  1  . SUMAtriptan 6  MG/0.5ML SOAJ as needed.  1  . vitamin C (ASCORBIC ACID) 500 MG tablet Take 500 mg by mouth daily.    . vitamin E 400 UNIT capsule Take 400 Units by mouth daily.    . Zoledronic Acid (RECLAST IV) Inject into the vein.    Marland Kitchen zonisamide (ZONEGRAN) 100 MG capsule Take 100 mg by mouth 2 (two) times daily.       PAST MEDICAL HISTORY: Past Medical History  Diagnosis Date  . IBS (irritable bowel syndrome)   . Diabetes mellitus   . Brain tumor (benign) ([place])   . Migraine   . Memory loss     PAST SURGICAL HISTORY: Past Surgical History  Procedure Laterality Date  . Brain surgery      x 2  . Heart ablation  2006  . Liver biopsy      FAMILY HISTORY: Family History  Problem Relation Age of Onset  . Hypertension Father   . Hypertension Mother   . Parkinson's disease Maternal Grandmother     SOCIAL HISTORY:  Social History   Social History  . Marital Status: Married    Spouse Name: N/A  . Number of Children: 0  . Years of Education: 16   Occupational History  .  Out of work on disability    Social History Main Topics  . Smoking status: Never Smoker   . Smokeless tobacco: Never Used  . Alcohol Use: 0.0 oz/week    0 Standard drinks or equivalent per week     Comment: occasional - 1/2 drink per week or less  . Drug Use: No  . Sexual Activity: Not on file   Other Topics Concern  . Not on file   Social History Narrative   Lives at home with her husband.   Left-handed.   Occasional caffeine use.        PHYSICAL EXAM   Filed Vitals:   12/22/14 1203  BP: 133/95  Pulse: 74  Height: 5' 5.5" (1.664 m)  Weight: 193 lb (87.544 kg)    Not recorded      Body mass index is 31.62 kg/(m^2).  PHYSICAL EXAMNIATION:  Gen: NAD, conversant, well nourised, obese, well groomed                     Cardiovascular: Regular rate rhythm, no peripheral edema, warm, nontender. Eyes: Conjunctivae clear without exudates or hemorrhage Neck: Supple, no carotid bruise. Pulmonary:  Clear to auscultation bilaterally   NEUROLOGICAL EXAM:  MENTAL STATUS: Speech:    Speech is normal; fluent and spontaneous with normal comprehension.  Cognition: MMSE 30/30, animal naming 11     Orientation to time, place and person     Normal recent and remote memory     Normal Attention span and concentration     Normal Language, naming, repeating,spontaneous speech     Fund of knowledge   CRANIAL NERVES: CN II: Visual fields are full to confrontation. Fundoscopic exam is normal with sharp discs and no vascular changes. Pupils are round equal and briskly reactive to light. CN III, IV, VI: extraocular movement are normal. No ptosis. CN V: Facial sensation is intact to pinprick in all 3 divisions bilaterally. Corneal responses are intact.  CN VII: Face is symmetric with normal eye closure and smile. CN VIII: Hearing is normal to rubbing fingers CN IX, X: Palate elevates symmetrically. Phonation is normal. CN XI: Head turning and shoulder shrug are intact CN XII: Tongue is midline with normal movements and no atrophy.  MOTOR: There is no pronator drift of out-stretched arms. Muscle bulk and tone are normal. Muscle strength is normal.  REFLEXES: Reflexes are 2+ and symmetric at the biceps, triceps, knees, and ankles. Plantar responses are flexor.  SENSORY: Intact to light touch, pinprick, position sense, and vibration sense are intact in fingers and toes.  COORDINATION: Rapid alternating movements and fine finger movements are intact. There is no dysmetria on finger-to-nose and heel-knee-shin.    GAIT/STANCE: Posture is normal. Gait is steady with normal steps, base, arm swing, and turning. Heel and toe walking are normal. Tandem gait is normal.  Romberg is absent.   DIAGNOSTIC DATA (LABS, IMAGING, TESTING) - I reviewed patient records, labs, notes, testing and imaging myself where available.   ASSESSMENT AND PLAN  Linda Hebert is a 44 y.o. female   memory  loss history of central neurocytoma, status post bilateral frontal approach subtotal resection, followed by radiation therapy   No treatable etiology found for her memory complaints,   this is most related to her craniotomy, and cranial radiation therapy  Have encouraged her continue to to be socially and physically active,  Only return to clinic for new issues  Marcial Pacas, M.D. Ph.D.  [facility] [phone]  [address] [place], [place] [address] Ph: [phone] Fax: [phone]  CC: Merrilee Seashore, MD. Dr. Arvella Nigh

## 2015-01-25 ENCOUNTER — Ambulatory Visit (INDEPENDENT_AMBULATORY_CARE_PROVIDER_SITE_OTHER): Payer: 59 | Admitting: Emergency Medicine

## 2015-01-25 VITALS — BP 130/86 | HR 101 | Temp 98.3°F | Resp 16 | Ht 65.5 in | Wt 193.0 lb

## 2015-01-25 DIAGNOSIS — J209 Acute bronchitis, unspecified: Secondary | ICD-10-CM

## 2015-01-25 DIAGNOSIS — J0140 Acute pansinusitis, unspecified: Secondary | ICD-10-CM

## 2015-01-25 MED ORDER — AMOXICILLIN-POT CLAVULANATE 875-125 MG PO TABS: 1.0000 | ORAL_TABLET | Freq: Two times a day (BID) | ORAL | Status: DC

## 2015-01-25 MED ORDER — PSEUDOEPHEDRINE-GUAIFENESIN ER 60-600 MG PO TB12: 1.0000 | ORAL_TABLET | Freq: Two times a day (BID) | ORAL | Status: AC

## 2015-01-25 MED ORDER — HYDROCOD POLST-CPM POLST ER 10-8 MG/5ML PO SUER: 5.0000 mL | Freq: Two times a day (BID) | ORAL | Status: DC

## 2015-01-25 NOTE — Patient Instructions (Signed)

## 2015-01-25 NOTE — Progress Notes (Signed)
Subjective:  Patient ID: Linda Hebert, female    DOB: 1970-04-28  Age: 45 y.o. MRN: [id number]  CC: Sinusitis; Sore Throat; Cough; and Headache   HPI Linda Hebert presents  patient has nasal congestion postnasal drainage or purulent nasal drainage. She has a sore throat. And pressure in her sinuses. She has cough with no wheezing or shortness of breath. She has fatigue. She has no fever or chills. No nausea or vomiting. No stool change. She's getting ready to travel to [place] and is concerned about flying and traveling while she is ill.  History Linda Hebert has a past medical history of IBS (irritable bowel syndrome); Diabetes mellitus; Brain tumor (benign) ([place]); Migraine; and Memory loss.   She has past surgical history that includes Brain surgery; heart ablation (2006); and Liver biopsy.   Her  family history includes Hypertension in her father and mother; Parkinson's disease in her maternal grandmother.  She   reports that she has never smoked. She has never used smokeless tobacco. She reports that she drinks alcohol. She reports that she does not use illicit drugs.  Outpatient Prescriptions Prior to Visit  Medication Sig Dispense Refill  . calcium carbonate (TUMS - DOSED IN MG ELEMENTAL CALCIUM) 500 MG chewable tablet Chew 1 tablet by mouth daily.    . famotidine (PEPCID AC) 10 MG chewable tablet Chew 10 mg by mouth 2 (two) times daily as needed.    Marland Kitchen levothyroxine (SYNTHROID, LEVOTHROID) 25 MCG tablet Take 25 mcg by mouth daily.    Marland Kitchen LORazepam (ATIVAN) 0.5 MG tablet Take 0.5 mg by mouth every 6 (six) hours as needed for anxiety.    Marland Kitchen MAGNESIUM-ZINC PO Take 400 mg by mouth once.    Marland Kitchen METOPROLOL SUCCINATE ER PO Take 25 mg by mouth daily.    . mometasone (NASONEX) 50 MCG/ACT nasal spray Place 2 sprays into the nose as needed.    . Multiple Vitamins-Minerals (ADEKS) chewable tablet Chew 2 tablets by mouth daily.    . Omeprazole 20 MG TBEC Take by mouth daily before  breakfast.    . vitamin C (ASCORBIC ACID) 500 MG tablet Take 500 mg by mouth daily.    . vitamin E 400 UNIT capsule Take 400 Units by mouth daily.    . Zoledronic Acid (RECLAST IV) Inject into the vein.    Marland Kitchen zonisamide (ZONEGRAN) 100 MG capsule Take 100 mg by mouth 2 (two) times daily.    . SUMAtriptan (IMITREX) 20 MG/ACT nasal spray as needed. Reported on 01/25/2015  1  . SUMAtriptan 6 MG/0.5ML SOAJ as needed. Reported on 01/25/2015  1   No facility-administered medications prior to visit.    Social History   Social History  . Marital Status: Married    Spouse Name: N/A  . Number of Children: 0  . Years of Education: 16   Occupational History  . Out of work on disability    Social History Main Topics  . Smoking status: Never Smoker   . Smokeless tobacco: Never Used  . Alcohol Use: 0.0 oz/week    0 Standard drinks or equivalent per week     Comment: occasional - 1/2 drink per week or less  . Drug Use: No  . Sexual Activity: Not Asked   Other Topics Concern  . None   Social History Narrative   Lives at home with her husband.   Left-handed.   Occasional caffeine use.        Review of Systems  Constitutional:  Positive for fatigue. Negative for fever, chills and appetite change.  HENT: Positive for congestion, postnasal drip, rhinorrhea, sinus pressure and sore throat. Negative for ear pain.   Eyes: Negative for pain and redness.  Respiratory: Positive for cough. Negative for shortness of breath and wheezing.   Cardiovascular: Negative for leg swelling.  Gastrointestinal: Negative for nausea, vomiting, abdominal pain, diarrhea, constipation and blood in stool.  Endocrine: Negative for polyuria.  Genitourinary: Negative for dysuria, urgency, frequency and flank pain.  Musculoskeletal: Negative for gait problem.  Skin: Negative for rash.  Neurological: Negative for weakness and headaches.  Psychiatric/Behavioral: Negative for confusion and decreased concentration. The  patient is not nervous/anxious.     Objective:  BP 130/86 mmHg  Pulse 101  Temp(Src) 98.3 F (36.8 C) (Oral)  Resp 16  Ht 5' 5.5" (1.664 m)  Wt 193 lb (87.544 kg)  BMI 31.62 kg/m2  SpO2 98%  Physical Exam  Constitutional: She is oriented to person, place, and time. She appears well-developed and well-nourished. No distress.  HENT:  Head: Normocephalic and atraumatic.  Right Ear: External ear normal.  Left Ear: External ear normal.  Nose: Nose normal.  Eyes: Conjunctivae and EOM are normal. Pupils are equal, round, and reactive to light. No scleral icterus.  Neck: Normal range of motion. Neck supple. No tracheal deviation present.  Cardiovascular: Normal rate, regular rhythm and normal heart sounds.   Pulmonary/Chest: Effort normal. No respiratory distress. She has no wheezes. She has no rales.  Abdominal: She exhibits no mass. There is no tenderness. There is no rebound and no guarding.  Musculoskeletal: She exhibits no edema.  Lymphadenopathy:    She has no cervical adenopathy.  Neurological: She is alert and oriented to person, place, and time. Coordination normal.  Skin: Skin is warm and dry. No rash noted.  Psychiatric: She has a normal mood and affect. Her behavior is normal.      Assessment & Plan:   Linda Hebert was seen today for sinusitis, sore throat, cough and headache.  Diagnoses and all orders for this visit:  Acute bronchitis, unspecified organism  Acute pansinusitis, recurrence not specified  Other orders -     amoxicillin-clavulanate (AUGMENTIN) 875-125 MG tablet; Take 1 tablet by mouth 2 (two) times daily. -     chlorpheniramine-HYDROcodone (TUSSIONEX PENNKINETIC ER) 10-8 MG/5ML SUER; Take 5 mLs by mouth 2 (two) times daily. -     pseudoephedrine-guaifenesin (MUCINEX D) 60-600 MG 12 hr tablet; Take 1 tablet by mouth every 12 (twelve) hours.   I am having Linda Hebert start on amoxicillin-clavulanate, chlorpheniramine-HYDROcodone, and  pseudoephedrine-guaifenesin. I am also having her maintain her Omeprazole, levothyroxine, mometasone, vitamin C, vitamin E, METOPROLOL SUCCINATE ER PO, calcium carbonate, adeks, zonisamide, famotidine, MAGNESIUM-ZINC PO, LORazepam, Zoledronic Acid (RECLAST IV), SUMAtriptan, and SUMAtriptan.  Meds ordered this encounter  Medications  . amoxicillin-clavulanate (AUGMENTIN) 875-125 MG tablet    Sig: Take 1 tablet by mouth 2 (two) times daily.    Dispense:  20 tablet    Refill:  0  . chlorpheniramine-HYDROcodone (TUSSIONEX PENNKINETIC ER) 10-8 MG/5ML SUER    Sig: Take 5 mLs by mouth 2 (two) times daily.    Dispense:  60 mL    Refill:  0  . pseudoephedrine-guaifenesin (MUCINEX D) 60-600 MG 12 hr tablet    Sig: Take 1 tablet by mouth every 12 (twelve) hours.    Dispense:  18 tablet    Refill:  0    Appropriate red flag conditions were discussed with the patient  as well as actions that should be taken.  Patient expressed his understanding.  Follow-up: Return if symptoms worsen or fail to improve.  Roselee Culver, MD

## 2015-01-26 ENCOUNTER — Telehealth: Payer: Self-pay

## 2015-01-26 NOTE — Telephone Encounter (Signed)
Tell her to get robitussin DM over the counter.  She is allergic to codeine, not hydrocodone.  They are different and usually people tolerate hydrocodone well.

## 2015-01-26 NOTE — Telephone Encounter (Signed)
Patient was prescribed cough syrup and she states that hydrocodone is on her list of allergies. She took the cough syrup without knowing and is now feeling more sick. She states that it causes stomach pain and wooziness. Patient would like to know if we can send something else.

## 2015-01-30 NOTE — Telephone Encounter (Signed)
Pt advised.

## 2015-02-07 DIAGNOSIS — Z0271 Encounter for disability determination: Secondary | ICD-10-CM

## 2016-06-24 ENCOUNTER — Encounter: Payer: Self-pay | Admitting: Family Medicine

## 2016-06-24 ENCOUNTER — Ambulatory Visit (INDEPENDENT_AMBULATORY_CARE_PROVIDER_SITE_OTHER): Payer: BLUE CROSS/BLUE SHIELD | Admitting: Family Medicine

## 2016-06-24 ENCOUNTER — Ambulatory Visit (INDEPENDENT_AMBULATORY_CARE_PROVIDER_SITE_OTHER): Payer: BLUE CROSS/BLUE SHIELD

## 2016-06-24 VITALS — BP 150/98 | HR 90 | Temp 97.6°F | Resp 16 | Ht 65.0 in | Wt 207.8 lb

## 2016-06-24 DIAGNOSIS — M5441 Lumbago with sciatica, right side: Secondary | ICD-10-CM

## 2016-06-24 DIAGNOSIS — R030 Elevated blood-pressure reading, without diagnosis of hypertension: Secondary | ICD-10-CM

## 2016-06-24 MED ORDER — CYCLOBENZAPRINE HCL 5 MG PO TABS: 2.5000 mg | ORAL_TABLET | Freq: Three times a day (TID) | ORAL | 0 refills | Status: DC | PRN

## 2016-06-24 MED ORDER — MELOXICAM 7.5 MG PO TABS: 7.5000 mg | ORAL_TABLET | Freq: Every day | ORAL | 0 refills | Status: DC

## 2016-06-24 NOTE — Patient Instructions (Addendum)
You likely have a sprained ligament or strained muscle in the low back or possible pinched nerve from disc, which can lead to some muscle spasm as well.  If blood pressure is under 160/100 can try the mobic each morning (do not combine with other over the counter pain relievers), flexeril up to 3 times per day.  Heat or ice to area as needed and recheck in next 5-7 days. Return to the clinic or go to the nearest emergency room if any of your symptoms worsen or new symptoms occur.   Back Pain, Adult Back pain is very common in adults.The cause of back pain is rarely dangerous and the pain often gets better over time.The cause of your back pain may not be known. Some common causes of back pain include:  Strain of the muscles or ligaments supporting the spine.  Wear and tear (degeneration) of the spinal disks.  Arthritis.  Direct injury to the back.  For many people, back pain may return. Since back pain is rarely dangerous, most people can learn to manage this condition on their own. Follow these instructions at home: Watch your back pain for any changes. The following actions may help to lessen any discomfort you are feeling:  Remain active. It is stressful on your back to sit or stand in one place for long periods of time. Do not sit, drive, or stand in one place for more than 30 minutes at a time. Take short walks on even surfaces as soon as you are able.Try to increase the length of time you walk each day.  Exercise regularly as directed by your health care provider. Exercise helps your back heal faster. It also helps avoid future injury by keeping your muscles strong and flexible.  Do not stay in bed.Resting more than 1-2 days can delay your recovery.  Pay attention to your body when you bend and lift. The most comfortable positions are those that put less stress on your recovering back. Always use proper lifting techniques, including: ? Bending your knees. ? Keeping the load close  to your body. ? Avoiding twisting.  Find a comfortable position to sleep. Use a firm mattress and lie on your side with your knees slightly bent. If you lie on your back, put a pillow under your knees.  Avoid feeling anxious or stressed.Stress increases muscle tension and can worsen back pain.It is important to recognize when you are anxious or stressed and learn ways to manage it, such as with exercise.  Take medicines only as directed by your health care provider. Over-the-counter medicines to reduce pain and inflammation are often the most helpful.Your health care provider may prescribe muscle relaxant drugs.These medicines help dull your pain so you can more quickly return to your normal activities and healthy exercise.  Apply ice to the injured area: ? Put ice in a plastic bag. ? Place a towel between your skin and the bag. ? Leave the ice on for 20 minutes, 2-3 times a day for the first 2-3 days. After that, ice and heat may be alternated to reduce pain and spasms.  Maintain a healthy weight. Excess weight puts extra stress on your back and makes it difficult to maintain good posture.  Contact a health care provider if:  You have pain that is not relieved with rest or medicine.  You have increasing pain going down into the legs or buttocks.  You have pain that does not improve in one week.  You have night pain.  You lose weight.  You have a fever or chills. Get help right away if:  You develop new bowel or bladder control problems.  You have unusual weakness or numbness in your arms or legs.  You develop nausea or vomiting.  You develop abdominal pain.  You feel faint. This information is not intended to replace advice given to you by your health care provider. Make sure you discuss any questions you have with your health care provider. Document Released: 12/31/2004 Document Revised: 05/11/2015 Document Reviewed: 05/04/2013 Elsevier Interactive Patient Education   2017 [employer].   IF you received an x-ray today, you will receive an invoice from [facility]. Please contact [facility] at [phone] with questions or concerns regarding your invoice.   IF you received labwork today, you will receive an invoice from [place]. Please contact [facility] at [phone] with questions or concerns regarding your invoice.   Our billing staff will not be able to assist you with questions regarding bills from these companies.  You will be contacted with the lab results as soon as they are available. The fastest way to get your results is to activate your My Chart account. Instructions are located on the last page of this paperwork. If you have not heard from [place] regarding the results in 2 weeks, please contact this office.

## 2016-06-24 NOTE — Progress Notes (Signed)
Subjective:  By signing my name below, I, Moises Blood, attest that this documentation has been prepared under the direction and in the presence of Merri Ray, MD. Electronically Signed: Moises Blood, [place]. 06/24/2016 , 10:01 AM .  Patient was seen in Room 12 .   Patient ID: Linda Hebert, female    DOB: 10-09-1970, 46 y.o.   MRN: [id number] Chief Complaint  Patient presents with  . Back Pain    per patient, bent at home and felt a "pop" appx 2 days ago   HPI Linda Hebert is a 46 y.o. female Here for back pain. 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   Patient states she bent down slightly to grab something in her refrigerator and felt a pop/snap in her right lower back 2 nights ago. She's been in pain ever since. Her pain has been running down towards her right buttock and radiating down her right leg, as far as her right knee. She reports that it's been hard to put pressure and weight on her right leg so she's been utilizing a cane. She denies weakness, urinary or bowel incontinence or saddle anesthesia. She's been taking advil-ibuprofen 4 tablets at a time, once 2 nights ago, and twice yesterday; denies taking any medications today. She also tried using a heating pad over the area but without relief. She denies history of back surgeries or issues in the past. She has had slight cough and hurts her back when she coughs. She denies fever or night sweats.   Her BP was elevated in triage today, but usually doesn't have elevated BP. She denies seizures in the past. 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   Patient Active Problem List   Diagnosis Date Noted  . Memory loss 12/22/2014  . Hx of craniotomy 12/22/2014  . Type II or unspecified type diabetes mellitus without mention of complication, not stated as uncontrolled 06/07/2013   Past Medical History:  Diagnosis Date  . Brain  tumor (benign) ([place])   . Diabetes mellitus   . IBS (irritable bowel syndrome)   . Memory loss   . Migraine    Past Surgical History:  Procedure Laterality Date  . BRAIN SURGERY     x 2  . heart ablation  2006  . LIVER BIOPSY     Allergies  Allergen Reactions  . Bactrim Nausea And Vomiting  . Ciprofloxacin   . Darvon [Propoxyphene] Nausea And Vomiting    Darvocet  . Morphine And Related   . Codeine Nausea Only   Prior to Admission medications   Medication Sig Start Date End Date Taking? Authorizing Provider  calcium carbonate (TUMS - DOSED IN MG ELEMENTAL CALCIUM) 500 MG chewable tablet Chew 1 tablet by mouth daily.   Yes [provider]  famotidine (PEPCID AC) 10 MG chewable tablet Chew 10 mg by mouth 2 (two) times daily as needed.   Yes [provider]  levothyroxine (SYNTHROID, LEVOTHROID) 25 MCG tablet Take 25 mcg by mouth daily.   Yes [provider]  LORazepam (ATIVAN) 0.5 MG tablet Take 0.5 mg by mouth every 6 (six) hours as needed for anxiety (for procedures/MRI).    Yes [provider]  MAGNESIUM-ZINC PO Take 400 mg by mouth once.   Yes [provider]  METOPROLOL SUCCINATE ER PO Take 25 mg by mouth daily.   Yes [provider]  mometasone (NASONEX) 50 MCG/ACT nasal spray Place 2 sprays into the nose as needed.   Yes [provider]  Multiple Vitamins-Minerals (ADEKS) chewable tablet Chew 1 tablet by mouth daily.    Yes [provider]  Omeprazole 20 MG TBEC Take by mouth daily before breakfast.   Yes [provider]  SUMAtriptan (IMITREX) 20 MG/ACT nasal spray as needed. Reported on 01/25/2015 08/02/14  Yes [provider]  vitamin C (ASCORBIC ACID) 500 MG tablet Take 500 mg by mouth daily.   Yes [provider]  vitamin E 400 UNIT capsule Take 400 Units by mouth daily.   Yes [provider]  Zoledronic Acid (RECLAST IV) Inject into the vein.   Yes [provider]  zonisamide (ZONEGRAN) 100 MG capsule Take 100 mg by mouth 2 (two) times daily.   Yes [provider]  amoxicillin-clavulanate (AUGMENTIN) 875-125 MG tablet Take 1 tablet by mouth 2 (two) times daily. Patient not taking: Reported on 06/24/2016 01/25/15   Roselee Culver, MD  chlorpheniramine-HYDROcodone [facility]) 10-8 MG/5ML SUER Take 5 mLs by mouth 2 (two) times daily. Patient not taking: Reported on 06/24/2016 01/25/15   Roselee Culver, MD  SUMAtriptan 6 MG/0.5ML SOAJ as needed. Reported on 01/25/2015 07/29/14   [provider]   Social History   Social History  . Marital status: Married    Spouse name: N/A  . Number of children: 0  . Years of education: 22   Occupational History  . Out of work on disability    Social History Main Topics  . Smoking status: Never Smoker  . Smokeless tobacco: Never Used  . Alcohol use 0.0 oz/week     Comment: occasional - 1/2 drink per week or less  . Drug use: No  . Sexual activity: Not on file   Other Topics Concern  . Not on file   Social History Narrative   Lives at home with her husband.   Left-handed.   Occasional caffeine use.      Review of Systems  Constitutional: Negative for chills, fatigue, fever and unexpected weight change.  Respiratory: Negative for cough.   Gastrointestinal: Negative for constipation, diarrhea, nausea and vomiting.  Musculoskeletal: Positive for back pain and myalgias.  Skin: Negative for rash and wound.  Neurological: Negative for dizziness, weakness, numbness and headaches.       Objective:   Physical Exam  Constitutional: She is oriented to person, place, and time. She appears well-developed and well-nourished. No distress.  HENT:  Head: Normocephalic and atraumatic.  Eyes: EOM are normal. Pupils are equal, round, and reactive to light.  Neck: Neck supple.  Cardiovascular: Normal rate.   Pulmonary/Chest: Effort normal. No respiratory  distress.  Musculoskeletal: Normal range of motion.  Locates pain over right lower paraspinal, no midline tenderness, right paraspinal tender with spasm, sciatic notch non tender, able to heel and toe walk but with pain; guarded flexion approximately to only 20-30 degrees, right rotation intact, guarded left rotation, minimal lateral flexion   Neurological: She is alert and oriented to person, place, and time. She displays no Babinski's sign on the right side. She displays no Babinski's sign on the left side.  Reflex Scores:      Patellar reflexes are 2+ on the right side and 2+ on the left side.      Achilles reflexes are 2+ on the right side and 2+ on the left side. Skin: Skin is warm and dry.  Psychiatric: She  has a normal mood and affect. Her behavior is normal.  Nursing note and vitals reviewed.   Vitals:   06/24/16 0922 06/24/16 1009  BP: (!) 164/118 (!) 150/98  Pulse: 90   Resp: 16   Temp: 97.6 F (36.4 C)   TempSrc: Oral   Weight: 207 lb 12.8 oz (94.3 kg)   Height: 5\' 5"  (1.651 m)    Dg Lumbar Spine Complete  Result Date: 06/24/2016 CLINICAL DATA:  Acute right-sided low back pain and leg pain. EXAM: LUMBAR SPINE - COMPLETE 4+ VIEW COMPARISON:  MRI 05/06/2015 FINDINGS: Minimal thoracolumbar curvature convex to the right and lower lumbar curvature convex to the left. No antero or retrolisthesis. No disc space narrowing. No significant facet arthropathy. No pars defect. No other focal finding. IMPRESSION: Minimal curvature. Otherwise normal radiographs. Patient previously known to have a right foraminal disc protrusion at L4-5 by MRI. Electronically Signed   By: Nelson Chimes M.D.   On: 06/24/2016 10:35       Assessment & Plan:    Linda Hebert is a 46 y.o. female Acute right-sided low back pain with right-sided sciatica - Plan: DG Lumbar Spine Complete Right low back strain, differential includes strain versus possible HNP with prior disc protrusion L4-5 by MRI as having  radicular symptoms. No weakness, reflexes equal, no red flags on exam or history.  - Has not tolerated narcotic pain medicines in the past. We'll initially treat with meloxicam, Flexeril 3 times a day when necessary. Can call if stronger medication needed as may try low-dose hydrocodone.  -Recheck within the next 5 days. If not significant improved, or worsening sooner, may need repeat advanced imaging with MRI or initial trial of prednisone. History of diabetes, would want to monitor sugars closely if we do use prednisone.  -ER/RTC precautions discussed if worsening sooner   Meds ordered this encounter  Medications  . meloxicam (MOBIC) 7.5 MG tablet    Sig: Take 1 tablet (7.5 mg total) by mouth daily.    Dispense:  30 tablet    Refill:  0  . cyclobenzaprine (FLEXERIL) 5 MG tablet    Sig: Take 0.5-2 tablets (2.5-10 mg total) by mouth 3 (three) times daily as needed.    Dispense:  30 tablet    Refill:  0   Patient Instructions   You likely have a sprained ligament or strained muscle in the low back or possible pinched nerve from disc, which can lead to some muscle spasm as well.  If blood pressure is under 160/100 can try the mobic each morning (do not combine with other over the counter pain relievers), flexeril up to 3 times per day.  Heat or ice to area as needed and recheck in next 5-7 days. Return to the clinic or go to the nearest emergency room if any of your symptoms worsen or new symptoms occur.   Back Pain, Adult Back pain is very common in adults.The cause of back pain is rarely dangerous and the pain often gets better over time.The cause of your back pain may not be known. Some common causes of back pain include:  Strain of the muscles or ligaments supporting the spine.  Wear and tear (degeneration) of the spinal disks.  Arthritis.  Direct injury to the back.  For many people, back pain may return. Since back pain is rarely dangerous, most people can learn to manage  this condition on their own. Follow these instructions at home: Watch your back pain for any  changes. The following actions may help to lessen any discomfort you are feeling:  Remain active. It is stressful on your back to sit or stand in one place for long periods of time. Do not sit, drive, or stand in one place for more than 30 minutes at a time. Take short walks on even surfaces as soon as you are able.Try to increase the length of time you walk each day.  Exercise regularly as directed by your health care provider. Exercise helps your back heal faster. It also helps avoid future injury by keeping your muscles strong and flexible.  Do not stay in bed.Resting more than 1-2 days can delay your recovery.  Pay attention to your body when you bend and lift. The most comfortable positions are those that put less stress on your recovering back. Always use proper lifting techniques, including: ? Bending your knees. ? Keeping the load close to your body. ? Avoiding twisting.  Find a comfortable position to sleep. Use a firm mattress and lie on your side with your knees slightly bent. If you lie on your back, put a pillow under your knees.  Avoid feeling anxious or stressed.Stress increases muscle tension and can worsen back pain.It is important to recognize when you are anxious or stressed and learn ways to manage it, such as with exercise.  Take medicines only as directed by your health care provider. Over-the-counter medicines to reduce pain and inflammation are often the most helpful.Your health care provider may prescribe muscle relaxant drugs.These medicines help dull your pain so you can more quickly return to your normal activities and healthy exercise.  Apply ice to the injured area: ? Put ice in a plastic bag. ? Place a towel between your skin and the bag. ? Leave the ice on for 20 minutes, 2-3 times a day for the first 2-3 days. After that, ice and heat may be alternated to reduce  pain and spasms.  Maintain a healthy weight. Excess weight puts extra stress on your back and makes it difficult to maintain good posture.  Contact a health care provider if:  You have pain that is not relieved with rest or medicine.  You have increasing pain going down into the legs or buttocks.  You have pain that does not improve in one week.  You have night pain.  You lose weight.  You have a fever or chills. Get help right away if:  You develop new bowel or bladder control problems.  You have unusual weakness or numbness in your arms or legs.  You develop nausea or vomiting.  You develop abdominal pain.  You feel faint. This information is not intended to replace advice given to you by your health care provider. Make sure you discuss any questions you have with your health care provider. Document Released: 12/31/2004 Document Revised: 05/11/2015 Document Reviewed: 05/04/2013 Elsevier Interactive Patient Education  2017 [employer].   IF you received an x-ray today, you will receive an invoice from [facility]. Please contact [facility] at [phone] with questions or concerns regarding your invoice.   IF you received labwork today, you will receive an invoice from [place]. Please contact [facility] at [phone] with questions or concerns regarding your invoice.   Our billing staff will not be able to assist you with questions regarding bills from these companies.  You will be contacted with the lab results as soon as they are available. The fastest way to get your results is to activate your My  Chart account. Instructions are located on the last page of this paperwork. If you have not heard from [place] regarding the results in 2 weeks, please contact this office.      I personally performed the services described in this documentation, which was scribed in my presence. The recorded information has been reviewed and considered for accuracy and  completeness, addended by me as needed, and agree with information above.  Signed,   Merri Ray, MD [facility] [place].  06/24/16 10:38 AM

## 2016-06-26 ENCOUNTER — Telehealth: Payer: Self-pay

## 2016-06-26 NOTE — Telephone Encounter (Signed)
She may not be getting accurate readings as wrist measurements may not be accurate. If she has access to a upper arm BP cuff or can have that checked at one of the local pharmacies she may obtain a more reliable reading. If her blood pressure is under 160 on the upper number and 100 on the lower number, she can try meloxicam temporarily, but then monitor blood pressure closely to make sure it does not go above 160/100.

## 2016-06-26 NOTE — Telephone Encounter (Signed)
Pt states she was prescribed Mobic 7.5 mg and she hasn't been able to take it, due to her blood pressure still being elevated 144/102 taken @ 8:30am while sitting at the table with arm on table and a few minutes later she took it on same arm and it read 138/90. She wants to know if she can get another anti-inflammatory medication because she isn't getting any relief from the inflammation due to her BP.  She isn't sure she can trust her wrist cuff.Please Advise

## 2016-07-01 ENCOUNTER — Encounter: Payer: Self-pay | Admitting: Family Medicine

## 2016-07-01 ENCOUNTER — Ambulatory Visit (INDEPENDENT_AMBULATORY_CARE_PROVIDER_SITE_OTHER): Payer: BLUE CROSS/BLUE SHIELD | Admitting: Family Medicine

## 2016-07-01 VITALS — BP 138/88 | HR 99 | Temp 98.0°F | Resp 18 | Ht 66.54 in | Wt 208.0 lb

## 2016-07-01 DIAGNOSIS — M5126 Other intervertebral disc displacement, lumbar region: Secondary | ICD-10-CM

## 2016-07-01 DIAGNOSIS — M545 Low back pain, unspecified: Secondary | ICD-10-CM

## 2016-07-01 MED ORDER — CYCLOBENZAPRINE HCL 10 MG PO TABS: 5.0000 mg | ORAL_TABLET | Freq: Three times a day (TID) | ORAL | 0 refills | Status: DC | PRN

## 2016-07-01 NOTE — Patient Instructions (Addendum)
As your pain is improving, I would continue the same course. Okay to continue meloxicam once per day, Tylenol as needed throughout the day, and the cyclobenzaprine up to 10 mg 3 times per day. You can try cutting back to half dose or 5 mg initially, then decrease to just bedtime as tolerated. If you are not continuing to improve in the next week to 10 days or worsening during that time, let me know as we could try prednisone. Return to the clinic or go to the nearest emergency room if any of your symptoms worsen or new symptoms occur.   Back Pain, Adult Back pain is very common in adults.The cause of back pain is rarely dangerous and the pain often gets better over time.The cause of your back pain may not be known. Some common causes of back pain include:  Strain of the muscles or ligaments supporting the spine.  Wear and tear (degeneration) of the spinal disks.  Arthritis.  Direct injury to the back.  For many people, back pain may return. Since back pain is rarely dangerous, most people can learn to manage this condition on their own. Follow these instructions at home: Watch your back pain for any changes. The following actions may help to lessen any discomfort you are feeling:  Remain active. It is stressful on your back to sit or stand in one place for long periods of time. Do not sit, drive, or stand in one place for more than 30 minutes at a time. Take short walks on even surfaces as soon as you are able.Try to increase the length of time you walk each day.  Exercise regularly as directed by your health care provider. Exercise helps your back heal faster. It also helps avoid future injury by keeping your muscles strong and flexible.  Do not stay in bed.Resting more than 1-2 days can delay your recovery.  Pay attention to your body when you bend and lift. The most comfortable positions are those that put less stress on your recovering back. Always use proper lifting techniques,  including: ? Bending your knees. ? Keeping the load close to your body. ? Avoiding twisting.  Find a comfortable position to sleep. Use a firm mattress and lie on your side with your knees slightly bent. If you lie on your back, put a pillow under your knees.  Avoid feeling anxious or stressed.Stress increases muscle tension and can worsen back pain.It is important to recognize when you are anxious or stressed and learn ways to manage it, such as with exercise.  Take medicines only as directed by your health care provider. Over-the-counter medicines to reduce pain and inflammation are often the most helpful.Your health care provider may prescribe muscle relaxant drugs.These medicines help dull your pain so you can more quickly return to your normal activities and healthy exercise.  Apply ice to the injured area: ? Put ice in a plastic bag. ? Place a towel between your skin and the bag. ? Leave the ice on for 20 minutes, 2-3 times a day for the first 2-3 days. After that, ice and heat may be alternated to reduce pain and spasms.  Maintain a healthy weight. Excess weight puts extra stress on your back and makes it difficult to maintain good posture.  Contact a health care provider if:  You have pain that is not relieved with rest or medicine.  You have increasing pain going down into the legs or buttocks.  You have pain that does not  improve in one week.  You have night pain.  You lose weight.  You have a fever or chills. Get help right away if:  You develop new bowel or bladder control problems.  You have unusual weakness or numbness in your arms or legs.  You develop nausea or vomiting.  You develop abdominal pain.  You feel faint. This information is not intended to replace advice given to you by your health care provider. Make sure you discuss any questions you have with your health care provider. Document Released: 12/31/2004 Document Revised: 05/11/2015 Document  Reviewed: 05/04/2013 Elsevier Interactive Patient Education  2017 [employer].    IF you received an x-ray today, you will receive an invoice from [facility]. Please contact [facility] Radiology at [phone] with questions or concerns regarding your invoice.   IF you received labwork today, you will receive an invoice from [place]. Please contact [facility] at [phone] with questions or concerns regarding your invoice.   Our billing staff will not be able to assist you with questions regarding bills from these companies.  You will be contacted with the lab results as soon as they are available. The fastest way to get your results is to activate your My Chart account. Instructions are located on the last page of this paperwork. If you have not heard from [place] regarding the results in 2 weeks, please contact this office.

## 2016-07-01 NOTE — Telephone Encounter (Signed)
Pt notified of MD recommendations and verbalized understanding

## 2016-07-01 NOTE — Progress Notes (Signed)
Subjective:  By signing my name below, I, Linda Hebert, attest that this documentation has been prepared under the direction and in the presence of Wendie Agreste, MD Electronically Signed: Ladene Artist, ED Scribe 07/01/2016 at 9:39 AM.   Patient ID: Linda Hebert, female    DOB: February 09, 1970, 46 y.o.   MRN: [id number]  Chief Complaint  Patient presents with  . 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    HPI Linda Hebert is a 46 y.o. female who presents to Primary Care at [facility] for a follow-up of low back pain. Last seen 6/11. Noted a pop followed by pain in right low back 2 days prior. Treated with Mobic and Flexeril.   Today, pt states that right low back pain has improved significantly since last visit. States she is approximately 80% better, however she notes right low back pain that she describes as "twinges" with prolonged sitting and stretching. Pt has been taking Mobic once daily, Tylenol a few times daily and 10 mg Flexeril 3 times daily but recently ran out. XR last visit showed minimal curvature otherwise normal. Did have a known L5-S1 disc protrusion on prior MRI.   Elevated BP Thought to be due to pain last visit. Normal at this visit.   Patient Active Problem List   Diagnosis Date Noted  . Memory loss 12/22/2014  . Hx of craniotomy 12/22/2014  . Type II or unspecified type diabetes mellitus without mention of complication, not stated as uncontrolled 06/07/2013   Past Medical History:  Diagnosis Date  . Brain tumor (benign) ([place])   . Diabetes mellitus   . IBS (irritable bowel syndrome)   . Memory loss   . Migraine    Past Surgical History:  Procedure Laterality Date  . BRAIN SURGERY     x 2  . heart ablation  2006  . LIVER BIOPSY     Allergies  Allergen Reactions  . Bactrim Nausea And Vomiting  . Ciprofloxacin   . Darvon [Propoxyphene] Nausea And Vomiting    Darvocet  . Morphine And  Related   . Codeine Nausea Only   Prior to Admission medications   Medication Sig Start Date End Date Taking? Authorizing Provider  calcium carbonate (TUMS - DOSED IN MG ELEMENTAL CALCIUM) 500 MG chewable tablet Chew 1 tablet by mouth daily.    [provider]  cyclobenzaprine (FLEXERIL) 5 MG tablet Take 0.5-2 tablets (2.5-10 mg total) by mouth 3 (three) times daily as needed. 06/24/16   Wendie Agreste, MD  famotidine (PEPCID AC) 10 MG chewable tablet Chew 10 mg by mouth 2 (two) times daily as needed.    [provider]  levothyroxine (SYNTHROID, LEVOTHROID) 25 MCG tablet Take 25 mcg by mouth daily.    [provider]  LORazepam (ATIVAN) 0.5 MG tablet Take 0.5 mg by mouth every 6 (six) hours as needed for anxiety (for procedures/MRI).     [provider]  MAGNESIUM-ZINC PO Take 400 mg by mouth once.    [provider]  meloxicam (MOBIC) 7.5 MG tablet Take 1 tablet (7.5 mg total) by mouth daily. 06/24/16   Wendie Agreste, MD  METOPROLOL SUCCINATE ER PO Take 25 mg by mouth daily.    [provider]  mometasone (NASONEX) 50 MCG/ACT nasal spray Place 2 sprays into the nose as needed.    [provider]  Multiple Vitamins-Minerals (ADEKS) chewable  tablet Chew 1 tablet by mouth daily.     [provider]  Omeprazole 20 MG TBEC Take by mouth daily before breakfast.    [provider]  SUMAtriptan (IMITREX) 20 MG/ACT nasal spray as needed. Reported on 01/25/2015 08/02/14   [provider]  SUMAtriptan 6 MG/0.5ML SOAJ as needed. Reported on 01/25/2015 07/29/14   [provider]  vitamin C (ASCORBIC ACID) 500 MG tablet Take 500 mg by mouth daily.    [provider]  vitamin E 400 UNIT capsule Take 400 Units by mouth daily.    [provider]  Zoledronic Acid (RECLAST IV) Inject into the vein.    [provider]  zonisamide (ZONEGRAN) 100 MG capsule Take 100 mg by mouth 2 (two)  times daily.    [provider]   Social History   Social History  . Marital status: Married    Spouse name: N/A  . Number of children: 0  . Years of education: 81   Occupational History  . Out of work on disability    Social History Main Topics  . Smoking status: Never Smoker  . Smokeless tobacco: Never Used  . Alcohol use 0.0 oz/week     Comment: occasional - 1/2 drink per week or less  . Drug use: No  . Sexual activity: Not on file   Other Topics Concern  . Not on file   Social History Narrative   Lives at home with her husband.   Left-handed.   Occasional caffeine use.      Review of Systems  Musculoskeletal: Positive for back pain.      Objective:   Physical Exam  Constitutional: She is oriented to person, place, and time. She appears well-developed and well-nourished. No distress.  HENT:  Head: Normocephalic and atraumatic.  Eyes: Conjunctivae and EOM are normal.  Neck: Neck supple. No tracheal deviation present.  Cardiovascular: Normal rate.   Pulmonary/Chest: Effort normal. No respiratory distress.  Musculoskeletal: Normal range of motion.  Tender along the R low paraspinals. Does have some associated spasm on the R. Minimal tender to the SI joint, primarily the paraspinals. Negative seated straight leg raise with pain in the back only. Flexion slow but to 90 degrees. Guarded extension. Catching sensation with R lateral flexion. Slight discomfort but able to heel and toe walk.  Neurological: She is alert and oriented to person, place, and time.  Reflex Scores:      Patellar reflexes are 2+ on the right side and 2+ on the left side.      Achilles reflexes are 2+ on the right side and 2+ on the left side. Skin: Skin is warm and dry.  Psychiatric: She has a normal mood and affect. Her behavior is normal.  Nursing note and vitals reviewed.  Vitals:   07/01/16 0921  BP: 138/88  Pulse: 99  Resp: 18  Temp: 98 F (36.7 C)  TempSrc: Oral  SpO2: 98%   Weight: 208 lb (94.3 kg)  Height: 5' 6.53" (1.69 m)      Assessment & Plan:    Linda Hebert is a 46 y.o. female Acute right-sided low back pain without sciatica  Protruded lumbar disc  Low back pain, right-sided. Improving. Previous disc protrusion, differential includes herniated nucleus pulposus.  -Based on improvement, will continue meloxicam, Tylenol, cyclobenzaprine. Taper cyclobenzaprine as tolerated, RTC precautions if not continuing to improve within the next week to 10 days. Would consider trial of prednisone initially, then possible advanced  imaging with MRI with prior HNP history.  Meds ordered this encounter  Medications  . cyclobenzaprine (FLEXERIL) 10 MG tablet    Sig: Take 0.5-1 tablets (5-10 mg total) by mouth 3 (three) times daily as needed for muscle spasms.    Dispense:  30 tablet    Refill:  0   Patient Instructions    As your pain is improving, I would continue the same course. Okay to continue meloxicam once per day, Tylenol as needed throughout the day, and the cyclobenzaprine up to 10 mg 3 times per day. You can try cutting back to half dose or 5 mg initially, then decrease to just bedtime as tolerated. If you are not continuing to improve in the next week to 10 days or worsening during that time, let me know as we could try prednisone. Return to the clinic or go to the nearest emergency room if any of your symptoms worsen or new symptoms occur.   Back Pain, Adult Back pain is very common in adults.The cause of back pain is rarely dangerous and the pain often gets better over time.The cause of your back pain may not be known. Some common causes of back pain include:  Strain of the muscles or ligaments supporting the spine.  Wear and tear (degeneration) of the spinal disks.  Arthritis.  Direct injury to the back.  For many people, back pain may return. Since back pain is rarely dangerous, most people can learn to manage this condition on their  own. Follow these instructions at home: Watch your back pain for any changes. The following actions may help to lessen any discomfort you are feeling:  Remain active. It is stressful on your back to sit or stand in one place for long periods of time. Do not sit, drive, or stand in one place for more than 30 minutes at a time. Take short walks on even surfaces as soon as you are able.Try to increase the length of time you walk each day.  Exercise regularly as directed by your health care provider. Exercise helps your back heal faster. It also helps avoid future injury by keeping your muscles strong and flexible.  Do not stay in bed.Resting more than 1-2 days can delay your recovery.  Pay attention to your body when you bend and lift. The most comfortable positions are those that put less stress on your recovering back. Always use proper lifting techniques, including: ? Bending your knees. ? Keeping the load close to your body. ? Avoiding twisting.  Find a comfortable position to sleep. Use a firm mattress and lie on your side with your knees slightly bent. If you lie on your back, put a pillow under your knees.  Avoid feeling anxious or stressed.Stress increases muscle tension and can worsen back pain.It is important to recognize when you are anxious or stressed and learn ways to manage it, such as with exercise.  Take medicines only as directed by your health care provider. Over-the-counter medicines to reduce pain and inflammation are often the most helpful.Your health care provider may prescribe muscle relaxant drugs.These medicines help dull your pain so you can more quickly return to your normal activities and healthy exercise.  Apply ice to the injured area: ? Put ice in a plastic bag. ? Place a towel between your skin and the bag. ? Leave the ice on for 20 minutes, 2-3 times a day for the first 2-3 days. After that, ice and heat may be alternated to reduce  pain and  spasms.  Maintain a healthy weight. Excess weight puts extra stress on your back and makes it difficult to maintain good posture.  Contact a health care provider if:  You have pain that is not relieved with rest or medicine.  You have increasing pain going down into the legs or buttocks.  You have pain that does not improve in one week.  You have night pain.  You lose weight.  You have a fever or chills. Get help right away if:  You develop new bowel or bladder control problems.  You have unusual weakness or numbness in your arms or legs.  You develop nausea or vomiting.  You develop abdominal pain.  You feel faint. This information is not intended to replace advice given to you by your health care provider. Make sure you discuss any questions you have with your health care provider. Document Released: 12/31/2004 Document Revised: 05/11/2015 Document Reviewed: 05/04/2013 Elsevier Interactive Patient Education  2017 [employer].    IF you received an x-ray today, you will receive an invoice from [facility]. Please contact [facility] at [phone] with questions or concerns regarding your invoice.   IF you received labwork today, you will receive an invoice from [place]. Please contact [facility] at [phone] with questions or concerns regarding your invoice.   Our billing staff will not be able to assist you with questions regarding bills from these companies.  You will be contacted with the lab results as soon as they are available. The fastest way to get your results is to activate your My Chart account. Instructions are located on the last page of this paperwork. If you have not heard from [place] regarding the results in 2 weeks, please contact this office.       I personally performed the services described in this documentation, which was scribed in my presence. The recorded information has been reviewed and considered for accuracy and  completeness, addended by me as needed, and agree with information above.  Signed,   Merri Ray, MD [facility] [place].  07/01/16 9:56 AM

## 2016-07-21 ENCOUNTER — Other Ambulatory Visit: Payer: Self-pay | Admitting: Family Medicine

## 2016-07-29 NOTE — Telephone Encounter (Signed)
Received request for meloxicam refill. Discussed return if not improving in 7-10 days at June 18 office visit as possibly may need prednisone instead of meloxicam. Please check status prior to refill of meloxicam

## 2016-11-17 ENCOUNTER — Encounter (HOSPITAL_COMMUNITY): Payer: Self-pay

## 2016-11-17 ENCOUNTER — Emergency Department (HOSPITAL_COMMUNITY): Payer: BLUE CROSS/BLUE SHIELD

## 2016-11-17 ENCOUNTER — Observation Stay (HOSPITAL_COMMUNITY)
Admission: EM | Admit: 2016-11-17 | Discharge: 2016-11-18 | Disposition: A | Discharge status: Home / Self Care | Payer: BLUE CROSS/BLUE SHIELD | Attending: Internal Medicine | Admitting: Internal Medicine

## 2016-11-17 DIAGNOSIS — G43909 Migraine, unspecified, not intractable, without status migrainosus: Secondary | ICD-10-CM | POA: Diagnosis not present

## 2016-11-17 DIAGNOSIS — Z86011 Personal history of benign neoplasm of the brain: Secondary | ICD-10-CM | POA: Diagnosis not present

## 2016-11-17 DIAGNOSIS — E876 Hypokalemia: Secondary | ICD-10-CM | POA: Insufficient documentation

## 2016-11-17 DIAGNOSIS — E119 Type 2 diabetes mellitus without complications: Secondary | ICD-10-CM | POA: Insufficient documentation

## 2016-11-17 DIAGNOSIS — R112 Nausea with vomiting, unspecified: Secondary | ICD-10-CM | POA: Insufficient documentation

## 2016-11-17 DIAGNOSIS — E039 Hypothyroidism, unspecified: Secondary | ICD-10-CM | POA: Insufficient documentation

## 2016-11-17 DIAGNOSIS — H5500 Unspecified nystagmus: Secondary | ICD-10-CM | POA: Insufficient documentation

## 2016-11-17 DIAGNOSIS — R42 Dizziness and giddiness: Principal | ICD-10-CM

## 2016-11-17 DIAGNOSIS — Z79899 Other long term (current) drug therapy: Secondary | ICD-10-CM | POA: Diagnosis not present

## 2016-11-17 DIAGNOSIS — F419 Anxiety disorder, unspecified: Secondary | ICD-10-CM | POA: Insufficient documentation

## 2016-11-17 LAB — URINALYSIS, ROUTINE W REFLEX MICROSCOPIC
BILIRUBIN URINE: NEGATIVE
Glucose, UA: NEGATIVE mg/dL
HGB URINE DIPSTICK: NEGATIVE
Ketones, ur: NEGATIVE mg/dL
Leukocytes, UA: NEGATIVE
Nitrite: NEGATIVE
PROTEIN: NEGATIVE mg/dL
Specific Gravity, Urine: 1.011 (ref 1.005–1.030)
pH: 7 (ref 5.0–8.0)

## 2016-11-17 LAB — BASIC METABOLIC PANEL
Anion gap: 11 (ref 5–15)
BUN: 9 mg/dL (ref 6–20)
CALCIUM: 8.8 mg/dL — AB (ref 8.9–10.3)
CO2: 19 mmol/L — AB (ref 22–32)
CREATININE: 0.84 mg/dL (ref 0.44–1.00)
Chloride: 109 mmol/L (ref 101–111)
Glucose, Bld: 158 mg/dL — ABNORMAL HIGH (ref 65–99)
Potassium: 3.1 mmol/L — ABNORMAL LOW (ref 3.5–5.1)
SODIUM: 139 mmol/L (ref 135–145)

## 2016-11-17 LAB — CBC
HCT: 40.8 % (ref 36.0–46.0)
Hemoglobin: 13.8 g/dL (ref 12.0–15.0)
MCH: 30.5 pg (ref 26.0–34.0)
MCHC: 33.8 g/dL (ref 30.0–36.0)
MCV: 90.3 fL (ref 78.0–100.0)
PLATELETS: 246 10*3/uL (ref 150–400)
RBC: 4.52 MIL/uL (ref 3.87–5.11)
RDW: 13.8 % (ref 11.5–15.5)
WBC: 7.9 10*3/uL (ref 4.0–10.5)

## 2016-11-17 LAB — PROTIME-INR
INR: 0.95
Prothrombin Time: 12.6 seconds (ref 11.4–15.2)

## 2016-11-17 LAB — POC URINE PREG, ED: PREG TEST UR: NEGATIVE

## 2016-11-17 LAB — CBG MONITORING, ED: GLUCOSE-CAPILLARY: 150 mg/dL — AB (ref 65–99)

## 2016-11-17 MED ORDER — ONDANSETRON HCL 4 MG/2ML IJ SOLN
4.0000 mg | Freq: Once | INTRAMUSCULAR | Status: AC
  Administered 2016-11-17: 4 mg via INTRAVENOUS
  Filled 2016-11-17: qty 2

## 2016-11-17 MED ORDER — ONDANSETRON HCL 4 MG/2ML IJ SOLN: 4.0000 mg | Freq: Four times a day (QID) | INTRAMUSCULAR | Status: DC | PRN

## 2016-11-17 MED ORDER — DIAZEPAM 5 MG PO TABS: 5.0000 mg | ORAL_TABLET | Freq: Four times a day (QID) | ORAL | 0 refills | Status: DC | PRN

## 2016-11-17 MED ORDER — PREDNISONE 20 MG PO TABS
60.0000 mg | ORAL_TABLET | Freq: Once | ORAL | Status: AC
  Administered 2016-11-17: 60 mg via ORAL
  Filled 2016-11-17: qty 3

## 2016-11-17 MED ORDER — MECLIZINE HCL 25 MG PO TABS
50.0000 mg | ORAL_TABLET | Freq: Once | ORAL | Status: AC
  Administered 2016-11-17: 50 mg via ORAL
  Filled 2016-11-17: qty 2

## 2016-11-17 MED ORDER — SUMATRIPTAN 20 MG/ACT NA SOLN
20.0000 mg | NASAL | Status: DC | PRN
  Filled 2016-11-17: qty 1

## 2016-11-17 MED ORDER — DIAZEPAM 5 MG/ML IJ SOLN
5.0000 mg | Freq: Once | INTRAMUSCULAR | Status: AC
  Administered 2016-11-17: 5 mg via INTRAVENOUS
  Filled 2016-11-17: qty 2

## 2016-11-17 MED ORDER — MECLIZINE HCL 25 MG PO TABS: 25.0000 mg | ORAL_TABLET | Freq: Three times a day (TID) | ORAL | 0 refills | Status: DC | PRN

## 2016-11-17 MED ORDER — POTASSIUM CHLORIDE CRYS ER 20 MEQ PO TBCR
40.0000 meq | EXTENDED_RELEASE_TABLET | Freq: Once | ORAL | Status: AC
  Administered 2016-11-17: 40 meq via ORAL
  Filled 2016-11-17: qty 2

## 2016-11-17 MED ORDER — MECLIZINE HCL 12.5 MG PO TABS: 25.0000 mg | ORAL_TABLET | Freq: Four times a day (QID) | ORAL | Status: DC | PRN

## 2016-11-17 MED ORDER — SODIUM CHLORIDE 0.9 % IV BOLUS (SEPSIS)
1000.0000 mL | Freq: Once | INTRAVENOUS | Status: AC
Start: 2016-11-17 — End: 2016-11-17
  Administered 2016-11-17: 1000 mL via INTRAVENOUS

## 2016-11-17 MED ORDER — GADOBENATE DIMEGLUMINE 529 MG/ML IV SOLN
20.0000 mL | Freq: Once | INTRAVENOUS | Status: AC | PRN
  Administered 2016-11-17: 20 mL via INTRAVENOUS

## 2016-11-17 NOTE — ED Notes (Signed)
Admitting at the bedside.  

## 2016-11-17 NOTE — ED Notes (Signed)
Pt actively vomiting, returned to room, Dr. Regenia Skeeter laid eyes on patient.

## 2016-11-17 NOTE — ED Notes (Signed)
Pt reports she is still dizzy and does not feel comfortable walking at this time. Will reasses pt in approx 10 minutes and attempt to ambulate.

## 2016-11-17 NOTE — ED Notes (Signed)
Pt transported to CT ?

## 2016-11-17 NOTE — ED Notes (Signed)
Attempted report x1. 

## 2016-11-17 NOTE — ED Notes (Addendum)
Pt reports she does not feel much better after meclizine administration. Pt reports the movement in CT caused worsening dizziness.

## 2016-11-17 NOTE — ED Notes (Signed)
Patient transported to MRI 

## 2016-11-17 NOTE — ED Notes (Signed)
Per MRI, pt to be transported for scan in approx 1 hour. Pt and family notified, pt reports she does not need medication for MRI.

## 2016-11-17 NOTE — ED Triage Notes (Signed)
Pt from home via EMS with dizziness and possible vertigo that began approx 0530. Pt also reports N/V x 1 hour. EMS VS: 150/62, 80 HR, 18 RR, 98% on RA. CBG 108.

## 2016-11-17 NOTE — ED Provider Notes (Signed)
MRI negative for acute process.  She is not feeling much better after IV Valium.  She is able to ambulate but very slowly and has to use a walker which is not typical.  She felt like she could try and go home with medicine control but then vomited when being wheeled out.  She tried to walk again after Zofran but is still not doing well.  We discussed admission for symptom control.  I did discuss with Dr. Leonel Ramsay who advises steroids for labyrinthitis given the continuous symptoms and nystagmus.  This is been given.  Discussed with Dr. Maudie Mercury, hospitalist to admit.   Sherwood Gambler, MD 11/17/16 2225

## 2016-11-17 NOTE — ED Notes (Signed)
ED Provider at bedside. 

## 2016-11-17 NOTE — ED Provider Notes (Signed)
[facility] EMERGENCY DEPARTMENT Provider Note   CSN: [id number] Arrival date & time: 11/17/16  1216     History   Chief Complaint Chief Complaint  Patient presents with  . Dizziness    HPI Linda Hebert is a 46 y.o. female.  The history is provided by the patient, the spouse, a parent and medical records. No language interpreter was used.  Dizziness  Quality:  Head spinning and room spinning Severity:  Severe Onset quality:  Gradual Duration:  1 day Timing:  Constant Progression:  Unchanged Chronicity:  New Context: not with loss of consciousness   Relieved by:  Being still Worsened by:  Movement Ineffective treatments:  None tried Associated symptoms: nausea and vomiting   Associated symptoms: no chest pain, no diarrhea, no headaches, no palpitations, no shortness of breath, no vision changes and no weakness   Risk factors: no hx of vertigo     Past Medical History:  Diagnosis Date  . Brain tumor (benign) ([place])   . Diabetes mellitus   . IBS (irritable bowel syndrome)   . Memory loss   . Migraine     Patient Active Problem List   Diagnosis Date Noted  . Memory loss 12/22/2014  . Hx of craniotomy 12/22/2014  . Type II or unspecified type diabetes mellitus without mention of complication, not stated as uncontrolled 06/07/2013    Past Surgical History:  Procedure Laterality Date  . BRAIN SURGERY     x 2  . heart ablation  2006  . LIVER BIOPSY      OB History    No data available       Home Medications    Prior to Admission medications   Medication Sig Start Date End Date Taking? Authorizing Provider  calcium carbonate (TUMS - DOSED IN MG ELEMENTAL CALCIUM) 500 MG chewable tablet Chew 1 tablet by mouth daily.    [provider]  cyclobenzaprine (FLEXERIL) 10 MG tablet Take 0.5-1 tablets (5-10 mg total) by mouth 3 (three) times daily as needed for muscle spasms. 07/01/16   Wendie Agreste, MD  famotidine (PEPCID AC)  10 MG chewable tablet Chew 10 mg by mouth 2 (two) times daily as needed.    [provider]  levothyroxine (SYNTHROID, LEVOTHROID) 25 MCG tablet Take 25 mcg by mouth daily.    [provider]  LORazepam (ATIVAN) 0.5 MG tablet Take 0.5 mg by mouth every 6 (six) hours as needed for anxiety (for procedures/MRI).     [provider]  MAGNESIUM-ZINC PO Take 400 mg by mouth once.    [provider]  meloxicam (MOBIC) 7.5 MG tablet Take 1 tablet (7.5 mg total) by mouth daily. 06/24/16   Wendie Agreste, MD  METOPROLOL SUCCINATE ER PO Take 25 mg by mouth daily.    [provider]  mometasone (NASONEX) 50 MCG/ACT nasal spray Place 2 sprays into the nose as needed.    [provider]  Multiple Vitamins-Minerals (ADEKS) chewable tablet Chew 1 tablet by mouth daily.     [provider]  Omeprazole 20 MG TBEC Take by mouth daily before breakfast.    [provider]  SUMAtriptan (IMITREX) 20 MG/ACT nasal spray as needed. Reported on 01/25/2015 08/02/14   [provider]  SUMAtriptan 6 MG/0.5ML SOAJ as needed. Reported on 01/25/2015 07/29/14   [provider]  vitamin C (ASCORBIC ACID) 500 MG tablet Take 500 mg by mouth daily.    [provider]  vitamin E 400 UNIT capsule Take 400 Units by mouth daily.    [provider]  Zoledronic Acid (RECLAST IV) Inject into the vein.    [provider]  zonisamide (ZONEGRAN) 100 MG capsule Take 100 mg by mouth 2 (two) times daily.    [provider]    Family History Family History  Problem Relation Age of Onset  . Hypertension Father   . Hypertension Mother   . Parkinson's disease Maternal Grandmother     Social History Social History   Tobacco Use  . Smoking status: Never Smoker  . Smokeless tobacco: Never Used  Substance Use Topics  . Alcohol use: Yes    Alcohol/week: 0.0 oz    Comment: occasional - 1/2 drink per week or less  . Drug  use: No     Allergies   Bactrim; Ciprofloxacin; Darvon [propoxyphene]; Morphine and related; and Codeine   Review of Systems Review of Systems  Constitutional: Negative for chills, fatigue and fever.  HENT: Negative for congestion and rhinorrhea.   Respiratory: Negative for cough, chest tightness, shortness of breath and wheezing.   Cardiovascular: Negative for chest pain, palpitations and leg swelling.  Gastrointestinal: Positive for nausea and vomiting. Negative for constipation and diarrhea.  Genitourinary: Negative for dysuria and flank pain.  Musculoskeletal: Negative for back pain, neck pain and neck stiffness.  Skin: Negative for rash and wound.  Neurological: Positive for dizziness. Negative for facial asymmetry, speech difficulty, weakness, light-headedness, numbness and headaches.  Psychiatric/Behavioral: Negative for agitation and confusion.     Physical Exam Updated Vital Signs BP (!) 144/111   Pulse 76   Temp 97.6 F (36.4 C) (Oral)   Resp 13   Ht 5\' 5"  (1.651 m)   Wt 90.7 kg (200 lb)   SpO2 95%   BMI 33.28 kg/m   Physical Exam  Constitutional: She is oriented to person, place, and time. She appears well-developed and well-nourished. No distress.  HENT:  Head: Normocephalic and atraumatic.  Mouth/Throat: Oropharynx is clear and moist.  Eyes: Conjunctivae are normal. No scleral icterus. Right eye exhibits nystagmus. Left eye exhibits nystagmus.  Neck: Neck supple.  Cardiovascular: Normal rate and regular rhythm. Exam reveals no gallop.  No murmur heard. Pulmonary/Chest: Effort normal and breath sounds normal. No respiratory distress.  Abdominal: Soft. There is no tenderness.  Musculoskeletal: She exhibits no edema or tenderness.  Neurological: She is alert and oriented to person, place, and time. She is not disoriented. She displays no tremor. No cranial nerve deficit or sensory deficit. She exhibits normal muscle tone. Coordination normal. GCS eye subscore  is 4. GCS verbal subscore is 5. GCS motor subscore is 6.  Skin: Skin is warm and dry. She is not diaphoretic.  Psychiatric: She has a normal mood and affect.  Nursing note and vitals reviewed.    ED Treatments / Results  Labs (all labs ordered are listed, but only abnormal results are displayed) Labs Reviewed  BASIC METABOLIC PANEL - Abnormal; Notable for the following components:      Result Value   Potassium 3.1 (*)    CO2 19 (*)    Glucose, Bld 158 (*)    Calcium 8.8 (*)    All other components within normal limits  URINALYSIS, ROUTINE W REFLEX MICROSCOPIC - Abnormal; Notable for the following components:   APPearance HAZY (*)    All other components within normal limits  CBG MONITORING, ED - Abnormal; Notable for the following components:   Glucose-Capillary 150 (*)  All other components within normal limits  CBC  PROTIME-INR  POC URINE PREG, ED    EKG  EKG Interpretation  Date/Time:  Sunday November 17 2016 13:00:39 EST Ventricular Rate:  79 PR Interval:    QRS Duration: 118 QT Interval:  490 QTC Calculation: 562 R Axis:   55 Text Interpretation:  Sinus rhythm Nonspecific intraventricular conduction delay Borderline T abnormalities, anterior leads No prior ECG for comparison.  T wave inversion in lead 3.  No STEMI Confirmed by Antony Blackbird [phone]) on 11/17/2016 1:16:07 PM       Radiology Ct Head Wo Contrast  Result Date: 11/17/2016 CLINICAL DATA:  Dizziness and vertigo beginning at 5:30 a.m. Nausea and vomiting. EXAM: CT HEAD WITHOUT CONTRAST TECHNIQUE: Contiguous axial images were obtained from the base of the skull through the vertex without intravenous contrast. COMPARISON:  None. FINDINGS: Brain: Remote right frontal ventriculostomy tract is noted. Acute infarct, hemorrhage, or mass lesion is present. There is no significant white matter disease. Ventricles are of normal size otherwise. No significant extraaxial fluid collection is present. Vascular: No  hyperdense vessel or unexpected calcification. Skull: Frontal craniotomy is present. Right frontal burr hole is evident. No acute or healing fracture is present. No focal lytic or blastic lesions are present. Sinuses/Orbits: Postsurgical changes of the frontal sinuses are evident bilaterally. There is opacification of the bilateral frontal sinuses. The remaining paranasal sinuses and mastoid air cells are clear. IMPRESSION: 1. No acute intracranial abnormality. 2. Remote right frontal ventriculostomy tract. 3. Frontal craniotomy and frontal sinus surgery with opacification of the residual frontal sinuses bilaterally. Electronically Signed   By: San Morelle M.D.   On: 11/17/2016 15:26    Procedures Procedures (including critical care time)  Medications Ordered in ED Medications  meclizine (ANTIVERT) tablet 50 mg (50 mg Oral Given 11/17/16 1324)  sodium chloride 0.9 % bolus 1,000 mL (1,000 mLs Intravenous New Bag/Given 11/17/16 1335)     Initial Impression / Assessment and Plan / ED Course  I have reviewed the triage vital signs and the nursing notes.  Pertinent labs & imaging results that were available during my care of the patient were reviewed by me and considered in my medical decision making (see chart for details).     Deidrea Gaetz Splitt is a 46 y.o. female with a past medical history significant for brain tumor status post craniotomy and removal in 2010, diabetes, and chronic memory loss who presents with dizziness, nausea, and vomiting.  Patient reports that she was last normal at 10 PM last night.  She reports that upon waking this morning, she was having mild room spinning sensation of dizziness.  She says that she has never had this before.  She says that he has continued to worsen progressing to her having continuous nausea and vomiting.  She denies recent head trauma.  She denies any vision changes.  She denies any remedies.  She denies any residual physical deficits since her  brain tumor other than memory loss.  She reports that she has increase in stress as she has a disability hearing for her memory loss next week.  She denies any new medications or any recent fevers, chills, headaches, or neck pain.  She denies any urinary symptoms or GI symptoms.  She denies any recent cough congestion or cold symptoms.  Patient does report that her dizziness is worsened with head movement, body movement, and eye movement.  On exam, patient has horizontal nystagmus.  Patient denies diplopia.  Patient has  normal sensation and strength in all extremities including face.  No facial droop.  No slurred speech.  Patient's vertigo is worsened with movement including head movement.  Lungs clear and chest nontender.  Abdomen nontender.  Patient has no significant lower extremity edema or tenderness.  Based on patient symptoms of dizziness that is brought on with movement, suspect a peripheral vertigo type dizziness however, given her history of brain tumor status post surgery, will obtain CT of the head to look for abnormal allergies.  Patient reports that she had previous imaging at [facility] earlier this year that was reassuring in regards to her tumor.  Patient will be given meclizine to begin treating initially.  Anticipate reassessment after workup.  3:51 PM Patient's diagnostic testing results are seen above.  Urinalysis shows no infection.  Pregnancy test negative.  Metabolic panel showed mild hypokalemia, potassium will be orally supplemented.  Creatinine normal.  CBC reassuring.  CT head showed no evidence of intracranial abnormality but there was evidence of the prior ventriculostomy and craniotomy.  No evidence of bleeding or large masses.  On reassessment, patient continued to have severe vertiginous dizziness despite the dose of meclizine.  Patient will be given fluids and reassess.  After discussion with the patient, decision made to have MRI brain with and without contrast to look for  recurrent mass.  If patient's MRI is reassuring, anticipate patient will be stable for discharge home with prescription of meclizine for likely vertigo.  If abnormalities are detected, patient may require admission.   Final Clinical Impressions(s) / ED Diagnoses   Final diagnoses:  Dizziness    Clinical Impression: 1. Dizziness     Disposition: Care transferred to Dr. Anastasia Pall, Gwenyth Allegra, MD 11/17/16 [phone]

## 2016-11-17 NOTE — ED Notes (Signed)
Pt ambulated with walker with assistance from Ina, EMT. Pt able to ambulate independently with small unsteady steps however pt reports severe dizziness, vertigo, and nausea. No vomiting noted.

## 2016-11-17 NOTE — ED Notes (Signed)
Upon wheeling pt out, pt began violently vomiting. Dr. Regenia Skeeter to pt room to assess pt.

## 2016-11-17 NOTE — ED Notes (Signed)
Pt reports dizziness with "room spinning" since 0530. Denies CP, SOB, one sided weakness, HA, blurry/double vision, balance changes, bladder changes. Pt A&Ox4. Pt reports nausea with 3 episodes of vomiting.

## 2016-11-17 NOTE — H&P (Signed)
TRH H&P   Patient Demographics:    Linda Hebert, is a 46 y.o. female  MRN: [id number]   DOB - 07-04-1970  Admit Date - 11/17/2016  Outpatient Primary MD for the patient is Merrilee Seashore, MD  Referring MD/NP/PA: Irena Cords  Outpatient Specialists:  Lynden Ang (neurosurgery), at [facility]  Patient coming from: home  Chief Complaint  Patient presents with  . Dizziness      HPI:    Linda Hebert  is a 46 y.o. female, w dm2, hx of migraines, brain tumor (benign), apparently presents w dizziness, vertigo starting this am.  Worse at noon   + nausea and vomitting. Movement made it worse.   denies tinnitus, hearing loss, family history.  MRI brain in ED negative for CVA,.  Pt was about to be discharged from ED and experienced more vertigo and n/v.  ED requested that the patient be admitted for intractable vertigo and n/v.      Review of systems:    In addition to the HPI above, No Fever-chills, SlightHeadache,  No changes with Vision or hearing, No problems swallowing food or Liquids, No Chest pain, Cough or Shortness of Breath, No Abdominal pain, No Nausea or Vommitting, Bowel movements are regular, No Blood in stool or Urine, No dysuria, No new skin rashes or bruises, No new joints pains-aches,  No new weakness, tingling, numbness in any extremity, No recent weight gain or loss, No polyuria, polydypsia or polyphagia, No significant Mental Stressors.  A full 10 point Review of Systems was done, except as stated above, all other Review of Systems were negative.   With Past History of the following :    Past Medical History:  Diagnosis Date  . Brain tumor (benign) ([place])   . Diabetes mellitus   . IBS (irritable bowel syndrome)   . Memory loss   . Migraine       Past Surgical History:  Procedure Laterality Date  . BRAIN SURGERY     x 2  . heart  ablation  2006  . LIVER BIOPSY        Social History:     Social History   Tobacco Use  . Smoking status: Never Smoker  . Smokeless tobacco: Never Used  Substance Use Topics  . Alcohol use: Yes    Alcohol/week: 0.0 oz    Comment: occasional - 1/2 drink per week or less     Lives - at home  Mobility - walks by self   Family History :     Family History  Problem Relation Age of Onset  . Hypertension Father   . Hypertension Mother   . Parkinson's disease Maternal Grandmother       Home Medications:   Prior to Admission medications   Medication Sig Start Date End Date Taking? Authorizing Provider  calcium carbonate (TUMS - DOSED IN MG ELEMENTAL CALCIUM) 500  MG chewable tablet Chew 1 tablet by mouth daily.    [provider]  cyclobenzaprine (FLEXERIL) 10 MG tablet Take 0.5-1 tablets (5-10 mg total) by mouth 3 (three) times daily as needed for muscle spasms. 07/01/16   Wendie Agreste, MD  diazepam (VALIUM) 5 MG tablet Take 1 tablet (5 mg total) every 6 (six) hours as needed by mouth (dizziness). 11/17/16   Sherwood Gambler, MD  famotidine (PEPCID AC) 10 MG chewable tablet Chew 10 mg 2 (two) times daily as needed by mouth for heartburn.     [provider]  levothyroxine (SYNTHROID, LEVOTHROID) 25 MCG tablet Take 25 mcg by mouth daily.    [provider]  LORazepam (ATIVAN) 0.5 MG tablet Take 0.5 mg by mouth every 6 (six) hours as needed for anxiety (for procedures/MRI).     [provider]  MAGNESIUM-ZINC PO Take 400 mg by mouth once.    [provider]  meclizine (ANTIVERT) 25 MG tablet Take 1 tablet (25 mg total) 3 (three) times daily as needed by mouth for dizziness. 11/17/16   Sherwood Gambler, MD  meloxicam (MOBIC) 7.5 MG tablet Take 1 tablet (7.5 mg total) by mouth daily. 06/24/16   Wendie Agreste, MD  metoprolol succinate (TOPROL-XL) 25 MG 24 hr tablet Take 25 mg by mouth daily.    [provider]  mometasone  (NASONEX) 50 MCG/ACT nasal spray Place 2 sprays daily as needed into the nose (allergies).     [provider]  Multiple Vitamins-Minerals (ADEKS) chewable tablet Chew 1 tablet by mouth daily.     [provider]  omeprazole (PRILOSEC) 40 MG capsule Take 40 mg daily before breakfast by mouth.     [provider]  SUMAtriptan (IMITREX) 20 MG/ACT nasal spray Place 20 mg as needed into the nose for migraine. Reported on 01/25/2015 08/02/14   [provider]  SUMAtriptan 6 MG/0.5ML SOAJ Inject 0.5 mLs as needed into the muscle (for migraine. May repeat in 1 hour if needed. Do not exceed 22mL in 24-48 hours.). Reported on 01/25/2015 07/29/14   [provider]  vitamin C (ASCORBIC ACID) 500 MG tablet Take 500 mg by mouth daily.    [provider]  vitamin E 400 UNIT capsule Take 400 Units by mouth daily.    [provider]  Zoledronic Acid (RECLAST IV) Inject into the vein. Once yearly    [provider]  zonisamide (ZONEGRAN) 100 MG capsule Take 100 mg by mouth 2 (two) times daily.    [provider]     Allergies:     Allergies  Allergen Reactions  . Morphine And Related Nausea Only    GI Upset   . Propoxyphene Nausea And Vomiting and Nausea Only  . Bactrim Nausea And Vomiting  . Sulfamethoxazole-Trimethoprim Nausea And Vomiting and Other (See Comments)       . Ciprofloxacin Nausea And Vomiting  . Codeine Nausea Only     Physical Exam:   Vitals  Blood pressure (!) 156/110, pulse 82, temperature 97.6 F (36.4 C), resp. rate 14, height 5\' 5"  (1.651 m), weight 90.7 kg (200 lb), SpO2 93 %.   1. General  lying in bed in NAD,   2. Normal affect and insight, Not Suicidal or Homicidal, Awake Alert, Oriented X 3.  3. No F.N deficits, ALL C.Nerves Intact, Strength 5/5 all 4 extremities, Sensation intact all 4 extremities, Plantars down going.  4. Ears and Eyes appear Normal, Conjunctivae clear, PERRLA. Moist  Oral  Mucosa.  5. Supple Neck, No JVD, No cervical lymphadenopathy appriciated, No Carotid Bruits.  6. Symmetrical Chest wall movement, Good air movement bilaterally, CTAB.  7. RRR, No Gallops, Rubs or Murmurs, No Parasternal Heave.  8. Positive Bowel Sounds, Abdomen Soft, No tenderness, No organomegaly appriciated,No rebound -guarding or rigidity.  9.  No Cyanosis, Normal Skin Turgor, No Skin Rash or Bruise.  10. Good muscle tone,  joints appear normal , no effusions, Normal ROM.  11. No Palpable Lymph Nodes in Neck or Axillae     Data Review:    CBC Recent Labs  Lab 11/17/16 1230  WBC 7.9  HGB 13.8  HCT 40.8  PLT 246  MCV 90.3  MCH 30.5  MCHC 33.8  RDW 13.8   ------------------------------------------------------------------------------------------------------------------  Chemistries  Recent Labs  Lab 11/17/16 1230  NA 139  K 3.1*  CL 109  CO2 19*  GLUCOSE 158*  BUN 9  CREATININE 0.84  CALCIUM 8.8*   ------------------------------------------------------------------------------------------------------------------ estimated creatinine clearance is 95.1 mL/min (by C-G formula based on SCr of 0.84 mg/dL). ------------------------------------------------------------------------------------------------------------------ No results for input(s): TSH, T4TOTAL, T3FREE, THYROIDAB in the last 72 hours.  Invalid input(s): FREET3  Coagulation profile Recent Labs  Lab 11/17/16 1230  INR 0.95   ------------------------------------------------------------------------------------------------------------------- No results for input(s): DDIMER in the last 72 hours. -------------------------------------------------------------------------------------------------------------------  Cardiac Enzymes No results for input(s): CKMB, TROPONINI, MYOGLOBIN in the last 168 hours.  Invalid input(s):  CK ------------------------------------------------------------------------------------------------------------------ No results found for: BNP   ---------------------------------------------------------------------------------------------------------------  Urinalysis    Component Value Date/Time   COLORURINE YELLOW 11/17/2016 1436   APPEARANCEUR HAZY (A) 11/17/2016 1436   LABSPEC 1.011 11/17/2016 1436   PHURINE 7.0 11/17/2016 1436   GLUCOSEU NEGATIVE 11/17/2016 1436   HGBUR NEGATIVE 11/17/2016 1436   BILIRUBINUR NEGATIVE 11/17/2016 1436   BILIRUBINUR Negative 04/16/2014 1152   KETONESUR NEGATIVE 11/17/2016 1436   PROTEINUR NEGATIVE 11/17/2016 1436   UROBILINOGEN 0.2 04/16/2014 1152   NITRITE NEGATIVE 11/17/2016 1436   LEUKOCYTESUR NEGATIVE 11/17/2016 1436    ----------------------------------------------------------------------------------------------------------------   Imaging Results:    Ct Head Wo Contrast  Result Date: 11/17/2016 CLINICAL DATA:  Dizziness and vertigo beginning at 5:30 a.m. Nausea and vomiting. EXAM: CT HEAD WITHOUT CONTRAST TECHNIQUE: Contiguous axial images were obtained from the base of the skull through the vertex without intravenous contrast. COMPARISON:  None. FINDINGS: Brain: Remote right frontal ventriculostomy tract is noted. Acute infarct, hemorrhage, or mass lesion is present. There is no significant white matter disease. Ventricles are of normal size otherwise. No significant extraaxial fluid collection is present. Vascular: No hyperdense vessel or unexpected calcification. Skull: Frontal craniotomy is present. Right frontal burr hole is evident. No acute or healing fracture is present. No focal lytic or blastic lesions are present. Sinuses/Orbits: Postsurgical changes of the frontal sinuses are evident bilaterally. There is opacification of the bilateral frontal sinuses. The remaining paranasal sinuses and mastoid air cells are clear. IMPRESSION: 1.  No acute intracranial abnormality. 2. Remote right frontal ventriculostomy tract. 3. Frontal craniotomy and frontal sinus surgery with opacification of the residual frontal sinuses bilaterally. Electronically Signed   By: San Morelle M.D.   On: 11/17/2016 15:26   Mr Jeri Cos And Wo Contrast  Result Date: 11/17/2016 CLINICAL DATA:  New onset of dizziness beginning at 5:30 a.m. today. Personal history of brain tumor. EXAM: MRI HEAD WITHOUT AND WITH CONTRAST TECHNIQUE: Multiplanar, multiecho pulse sequences of the brain and surrounding structures were obtained without and with intravenous contrast. CONTRAST:  20  mL MultiHance COMPARISON:  CT head without contrast from the same day and CT head without contrast 12/09/2004 report of MRI brain 06/12/2002. FINDINGS: Brain: A nonenhancing suprasellar heterogeneous cystic lesion is noted. This corresponds with the previously treated tumor. Lesion measures 10 x 18 x 16 mm. Right frontal ventriculostomy tract is again noted. There is no enhancement along the tract. No acute infarct, hemorrhage, or mass lesion is present. The ventricles are otherwise normal in size. No significant extra-axial fluid collection is present. White matter changes are noted about the frontal horns of the lateral ventricles. The brainstem and cerebellum are normal. Marked thinning of the anterior corpus callosum in genu is noted. Thickening of the dura anteriorly is likely postsurgical. Vascular: Flow is present in the major intracranial arteries. Skull and upper cervical spine: The skullbase is within normal limits. The craniocervical junction is normal. Marrow signal is normal. Sinuses/Orbits: Frontal sinus opacification is noted following surgery. The paranasal sinuses and mastoid air cells are clear. Globes and orbits are within normal limits bilaterally. IMPRESSION: 1. Postsurgical changes of frontal craniotomy including the frontal sinuses. 2. Residual 1.8 cm benign-appearing cystic  lesion just above the optic chiasm and third ventricle without significant mass effect. 3. No acute or focal lesion to explain sudden onset of dizziness or vertigo. Electronically Signed   By: San Morelle M.D.   On: 11/17/2016 17:49     Assessment & Plan:    Principal Problem:   Vertigo Active Problems:   Nausea & vomiting   Hypokalemia    BPV PT to evaluate in the AM Meclizine  N/v Tele Trop I q6h x3  Hypokalemia Replete Check cmp in am  Migraine imitrex Cont zonisamide  Hyothyroidism Cont levothyroxine  Hypertension/ h/o SVT Cont metoprolol  Anxiety Cont lorazepam  DVT Prophylaxis  Lovenox - SCDs  AM Labs Ordered, also please review Full Orders  Family Communication: Admission, patients condition and plan of care including tests being ordered have been discussed with the patient who indicate understanding and agree with the plan and Code Status.  Code Status FULL CODE  Likely DC to  home  Condition GUARDED    Consults called: none  Admission status: inpatient   Time spent in minutes : 45    Jani Gravel M.D on 11/17/2016 at 10:20 PM  Between 7am to 7pm - Pager - [phone]  . After 7pm go to [email or web address] - password [facility]  [facility] - Office  [phone]

## 2016-11-17 NOTE — ED Notes (Signed)
Patient ambulated in the hall with walker, tolerated well. Patient stated her head felt "like it was swimming" before and after ambulated. RN notified.

## 2016-11-17 NOTE — ED Notes (Signed)
Per MRI, pt listed to be transported for scan next.

## 2016-11-17 NOTE — ED Notes (Signed)
EDP at bedside  

## 2016-11-17 NOTE — ED Notes (Signed)
Patient transported to CT 

## 2016-11-17 NOTE — Consult Note (Signed)
Neurology Consultation Reason for Consult: Vertigo Referring Physician: Lauraine Rinne  CC: Vertigo  History is obtained from: Patient, father  HPI: Linda Hebert is a 46 y.o. 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  Over the course of the day, she has had worsening vertigo.  She states that this morning when she first woke up, she felt off, but that she has gotten significantly worse as time has gone on over the course of the day.     ROS: A 14 point ROS was performed and is negative except as noted in the HPI.  Past Medical History:  Diagnosis Date  . Brain tumor (benign) ([place])   . Diabetes mellitus   . IBS (irritable bowel syndrome)   . Memory loss   . Migraine      Family History  Problem Relation Age of Onset  . Hypertension Father   . Hypertension Mother   . Parkinson's disease Maternal Grandmother      Social History:  reports that  has never smoked. she has never used smokeless tobacco. She reports that she drinks alcohol. She reports that she does not use drugs.   Exam: Current vital signs: BP (!) 156/110   Pulse 82   Temp 97.6 F (36.4 C)   Resp 14   Ht 5\' 5"  (1.651 m)   Wt 90.7 kg (200 lb)   SpO2 93%   BMI 33.28 kg/m  Vital signs in last 24 hours: Temp:  [97.6 F (36.4 C)] 97.6 F (36.4 C) (11/04 1321) Pulse Rate:  [66-84] 82 (11/04 2116) Resp:  [11-16] 14 (11/04 2036) BP: (144-164)/(98-112) 156/110 (11/04 2115) SpO2:  [93 %-100 %] 93 % (11/04 2116) Weight:  [90.7 kg (200 lb)] 90.7 kg (200 lb) (11/04 1222)   Physical Exam  Constitutional: Appears well-developed and well-nourished.  Psych: Affect appropriate to situation Eyes: No scleral injection HENT: No OP obstrucion Head: Normocephalic.  Cardiovascular: Normal rate and regular rhythm.  Respiratory: Effort normal and breath sounds normal to anterior ascultation GI: Soft.  No distension.  There is no tenderness.  Skin: WDI  Neuro: Mental Status: Patient is awake, alert, oriented to person, place, month, year, and situation. Patient is able to give a clear and coherent history. No signs of aphasia or neglect Cranial Nerves: II: Visual Fields are full. Pupils are equal, round, and reactive to light.   III,IV, VI: EOMI with left beating nystagmus in all directions of gaze, head impulse test is positive with rightward head turn.  V: Facial sensation is symmetric to temperature VII: Facial movement is symmetric.  VIII: hearing is intact to voice X: Uvula elevates symmetrically XI: Shoulder shrug is symmetric. XII: tongue is midline without atrophy or fasciculations.  Motor: Tone is normal. Bulk is normal. 5/5 strength was present in all four extremities.  Sensory: Sensation is symmetric to light touch and temperature in the arms and legs. Cerebellar: FNF and HKS are intact bilaterally  I have reviewed labs in epic and the results pertinent to this consultation are: Mild hypokalemia  I have reviewed the images obtained: MRI brain-no acute findings  Impression: 46 year old female with vertigo with findings on physical exam referable to the right peripheral vestibular system consistent with vestibulitis.  Recommendations: 1) prednisone 60 mg x 5 days followed by decreasing by 10 mg/day for a total of 10 days treatment(e.g 50mg  day 6, 40mg  day 7...etc) 2) PT 3) no further recommendations  or workup needed at this time, neurology will sign off, please call with further questions or concerns.   Roland Rack, MD [facility] [phone]  If 7pm- 7am, please page neurology on call as listed in [place].

## 2016-11-18 ENCOUNTER — Other Ambulatory Visit: Payer: Self-pay

## 2016-11-18 DIAGNOSIS — R112 Nausea with vomiting, unspecified: Secondary | ICD-10-CM | POA: Diagnosis not present

## 2016-11-18 DIAGNOSIS — R42 Dizziness and giddiness: Secondary | ICD-10-CM | POA: Diagnosis not present

## 2016-11-18 LAB — BASIC METABOLIC PANEL
Anion gap: 9 (ref 5–15)
BUN: 8 mg/dL (ref 6–20)
CALCIUM: 9 mg/dL (ref 8.9–10.3)
CO2: 18 mmol/L — ABNORMAL LOW (ref 22–32)
CREATININE: 0.79 mg/dL (ref 0.44–1.00)
Chloride: 110 mmol/L (ref 101–111)
Glucose, Bld: 170 mg/dL — ABNORMAL HIGH (ref 65–99)
Potassium: 4 mmol/L (ref 3.5–5.1)
SODIUM: 137 mmol/L (ref 135–145)

## 2016-11-18 MED ORDER — PREDNISONE 10 MG PO TABS: ORAL_TABLET | ORAL | 0 refills | Status: DC

## 2016-11-18 MED ORDER — MECLIZINE HCL 25 MG PO TABS: 25.0000 mg | ORAL_TABLET | Freq: Four times a day (QID) | ORAL | 0 refills | Status: DC | PRN

## 2016-11-18 MED ORDER — FLUTICASONE PROPIONATE 50 MCG/ACT NA SUSP
2.0000 | Freq: Every day | NASAL | Status: DC
  Administered 2016-11-18: 2 via NASAL
  Filled 2016-11-18: qty 16

## 2016-11-18 MED ORDER — LEVOTHYROXINE SODIUM 25 MCG PO TABS
25.0000 ug | ORAL_TABLET | Freq: Every day | ORAL | Status: DC
  Administered 2016-11-18: 25 ug via ORAL
  Filled 2016-11-18: qty 1

## 2016-11-18 MED ORDER — MELOXICAM 7.5 MG PO TABS
7.5000 mg | ORAL_TABLET | Freq: Every day | ORAL | Status: DC
  Administered 2016-11-18: 7.5 mg via ORAL
  Filled 2016-11-18: qty 1

## 2016-11-18 MED ORDER — PREDNISONE 20 MG PO TABS: 60.0000 mg | ORAL_TABLET | Freq: Every day | ORAL | Status: DC

## 2016-11-18 MED ORDER — FAMOTIDINE 10 MG PO TABS: 10.0000 mg | ORAL_TABLET | Freq: Two times a day (BID) | ORAL | Status: DC | PRN

## 2016-11-18 MED ORDER — AQUADEKS PO LIQD
1.0000 mL | Freq: Every day | ORAL | Status: DC
Start: 2016-11-18 — End: 2016-11-18
  Administered 2016-11-18: 1 mL via ORAL
  Filled 2016-11-18: qty 1

## 2016-11-18 MED ORDER — PANTOPRAZOLE SODIUM 40 MG PO TBEC
40.0000 mg | DELAYED_RELEASE_TABLET | Freq: Every day | ORAL | Status: DC
  Administered 2016-11-18: 40 mg via ORAL
  Filled 2016-11-18: qty 1

## 2016-11-18 MED ORDER — HYDRALAZINE HCL 20 MG/ML IJ SOLN: 5.0000 mg | Freq: Four times a day (QID) | INTRAMUSCULAR | Status: DC | PRN

## 2016-11-18 MED ORDER — ZONISAMIDE 100 MG PO CAPS
100.0000 mg | ORAL_CAPSULE | Freq: Two times a day (BID) | ORAL | Status: DC
  Administered 2016-11-18 (×2): 100 mg via ORAL
  Filled 2016-11-18 (×3): qty 1

## 2016-11-18 MED ORDER — LORAZEPAM 0.5 MG PO TABS
0.5000 mg | ORAL_TABLET | Freq: Four times a day (QID) | ORAL | Status: DC | PRN
Start: 2016-11-18 — End: 2016-11-18

## 2016-11-18 MED ORDER — METOPROLOL SUCCINATE ER 25 MG PO TB24
25.0000 mg | ORAL_TABLET | Freq: Every day | ORAL | Status: DC
  Administered 2016-11-18: 25 mg via ORAL
  Filled 2016-11-18: qty 1

## 2016-11-18 NOTE — Progress Notes (Signed)
Pt admitted from ED with c/o of dizziness, nausea, vomiting, pt alert and oriented but looks tired, settled in bed with call light at bedside, tele monitor put and verified on pt, was however reassured, will continue to monitor. Ob[place]-Asidi, Dezmen Alcock Efe

## 2016-11-18 NOTE — Evaluation (Signed)
Physical Therapy Evaluation Patient Details Name: Linda Hebert MRN: [id number] DOB: Feb 09, 1970 Today's Date: 11/18/2016   History of Present Illness  Braeden Dolinski  is a 46 y.o. female, w dm2, hx of migraines, brain tumor (benign), apparently presents w dizziness, vertigo starting this am.  Worse at noon   + nausea and vomitting. Movement made it worse.   denies tinnitus, hearing loss, family history.  MRI brain in ED negative for CVA,.  Clinical Impression  Patient presents with decreased mobility due to decreased balance, decreased activity tolerance all related to R vestibular hypofunction.  She may be okay for home with HEP as educated this session, but have given her outpatient neurorehabilitation referral form to take to PCP if further training needed.  Will see again if not d/c.     Follow Up Recommendations No PT follow up    Equipment Recommendations  None recommended by PT    Recommendations for Other Services       Precautions / Restrictions Precautions Precautions: Fall      Mobility  Bed Mobility Overal bed mobility: Needs Assistance Bed Mobility: Supine to Sit     Supine to sit: Supervision;HOB elevated     General bed mobility comments: increased time, cues for visual fixation if needed  Transfers Overall transfer level: Needs assistance   Transfers: Sit to/from Stand Sit to Stand: Supervision         General transfer comment: slow and cues again if needed for compensation  Ambulation/Gait Ambulation/Gait assistance: Min guard;Supervision Ambulation Distance (Feet): 220 Feet Assistive device: None Gait Pattern/deviations: Step-through pattern;Decreased stride length;Wide base of support     General Gait Details: slow pace, cues throughout to minimize symptoms with assist for compensation.   Stairs Stairs: Yes Stairs assistance: Supervision Stair Management: Alternating pattern;Forwards;Two rails Number of Stairs: 4 General stair  comments: cues for using railings  Wheelchair Mobility    Modified Rankin (Stroke Patients Only)       Balance Overall balance assessment: Needs assistance   Sitting balance-Leahy Scale: Good       Standing balance-Leahy Scale: Fair                               Pertinent Vitals/Pain Pain Assessment: 0-10 Pain Score: 2  Pain Descriptors / Indicators: Headache Pain Intervention(s): Monitored during session    Home Living Family/patient expects to be discharged to:: Private residence Living Arrangements: Alone Available Help at Discharge: Family;Available PRN/intermittently(parents live 6 houses away) Type of Home: Mobile home Home Access: Stairs to enter Entrance Stairs-Rails: Can reach both Entrance Stairs-Number of Steps: 6 Home Layout: One level Home Equipment: Grab bars - tub/shower      Prior Function Level of Independence: Independent         Comments: on disability; gets groceries, has had help recently with meals     Hand Dominance   Dominant Hand: Left    Extremity/Trunk Assessment   Upper Extremity Assessment Upper Extremity Assessment: Overall WFL for tasks assessed    Lower Extremity Assessment Lower Extremity Assessment: Overall WFL for tasks assessed       Communication   Communication: No difficulties  Cognition Arousal/Alertness: Awake/alert Behavior During Therapy: WFL for tasks assessed/performed Overall Cognitive Status: Within Functional Limits for tasks assessed  General Comments    Vestibular Assessment - 11/18/16 0001      Vestibular Assessment   General Observation  Patient reports onset of spinning yesterday with nausea and worse with movement.  States URI about a month ago.  No change in vision or hearing, but feels looking to L is blurry.  States no falls, has been better with medicine, but still off balance.   (Pended)       Symptom Behavior   Type  of Dizziness  Spinning  (Pended)     Frequency of Dizziness  intermittent  (Pended)     Duration of Dizziness  minutes  (Pended)     Aggravating Factors  Activity in general  (Pended)     Relieving Factors  Slow movements  (Pended)       Occulomotor Exam   Occulomotor Alignment  Normal  (Pended)     Spontaneous  Absent  (Pended)     Gaze-induced  Absent  (Pended)     Head shaking Horizontal  L beating nystagmus  (Pended)     Smooth Pursuits  Intact  (Pended)     Saccades  Intact  (Pended)       Vestibulo-Occular Reflex   VOR 1 Head Only (x 1 viewing)  x 30 sec horizontal movements intact, but provoking mildly  (Pended)     VOR to Slow Head Movement  Positive right  (Pended)     VOR Cancellation  Normal  (Pended)       Auditory   Comments  intact and equal to scratch test bilaterally   (Pended)       Positional Testing   Sidelying Test  Sidelying Right;Sidelying Left  (Pended)     Horizontal Canal Testing  Horizontal Canal Right;Horizontal Canal Left  (Pended)       Sidelying Right   Sidelying Right Duration  30 sec  (Pended)     Sidelying Right Symptoms  No nystagmus  (Pended)       Sidelying Left   Sidelying Left Duration  30 sec  (Pended)     Sidelying Left Symptoms  No nystagmus  (Pended)       Horizontal Canal Right   Horizontal Canal Right Duration  30 sec  (Pended)     Horizontal Canal Right Symptoms  Normal  (Pended)       Horizontal Canal Left   Horizontal Canal Left Duration  30 sec  (Pended)     Horizontal Canal Left Symptoms  Normal  (Pended)          Exercises     Assessment/Plan    PT Assessment Patient needs continued PT services  PT Problem List Impaired sensation;Decreased balance;Decreased mobility       PT Treatment Interventions DME instruction;Therapeutic activities;Gait training;Therapeutic exercise;Patient/family education;Balance training;Neuromuscular re-education;Other (comment);Functional mobility training(vestibular rehab)    PT Goals  (Current goals can be found in the Care Plan section)  Acute Rehab PT Goals Patient Stated Goal: to get better PT Goal Formulation: With patient Time For Goal Achievement: 11/21/16 Potential to Achieve Goals: Good    Frequency Min 3X/week   Barriers to discharge Decreased caregiver support      Co-evaluation               AM-PAC PT "6 Clicks" Daily Activity  Outcome Measure Difficulty turning over in bed (including adjusting bedclothes, sheets and blankets)?: A Little Difficulty moving from lying on back to sitting on the side of the bed? : Unable Difficulty  sitting down on and standing up from a chair with arms (e.g., wheelchair, bedside commode, etc,.)?: A Little Help needed moving to and from a bed to chair (including a wheelchair)?: A Little Help needed walking in hospital room?: A Little Help needed climbing 3-5 steps with a railing? : A Little 6 Click Score: 16    End of Session Equipment Utilized During Treatment: Gait belt Activity Tolerance: Patient tolerated treatment well Patient left: in bed;with family/visitor present   PT Visit Diagnosis: Other abnormalities of gait and mobility (R26.89);Dizziness and giddiness (R42)    Time: 0722-5750 PT Time Calculation (min) (ACUTE ONLY): 64 min   Charges:   PT Evaluation $PT Eval Moderate Complexity: 1 Mod PT Treatments $Gait Training: 8-22 mins $Neuromuscular Re-education: 8-22 mins $Self Care/Home Management: 2022/10/02   PT G CodesMagda Kiel, [place] 11/18/2016   Reginia Naas 11/18/2016, 5:38 PM

## 2016-11-18 NOTE — Patient Instructions (Addendum)
Gaze Stabilization: Sitting    Keeping eyes on target on wall \\_10N  feet away, tilt head down 15-30 and move head side to side for 30-60____ seconds. Repeat while moving head up and down for _30-60___ seconds. Do _5___ sessions per day. Repeat using target on pattern background.  Copyright  VHI. All rights reserved.  Gaze Stabilization: Tip Card  1.Target must remain in focus, not blurry, and appear stationary while head is in motion. 2.Perform exercises with small head movements (45 to either side of midline). 3.Increase speed of head motion so long as target is in focus. 4.If you wear eyeglasses, be sure you can see target through lens (therapist will give specific instructions for bifocal / progressive lenses). 5.These exercises may provoke dizziness or nausea. Work through these symptoms. If too dizzy, slow head movement slightly. Rest between each exercise. 6.Exercises demand concentration; avoid distractions. 7.For safety, perform standing exercises close to a counter, wall, corner, or next to someone.  Copyright  VHI. All rights reserved.  Gaze Stabilization: Standing Feet Apart    Feet shoulder width apart, keeping eyes on target on wall _5___ feet away, tilt head down 15-30 and move head side to side for _30-60___ seconds. Repeat while moving head up and down for __30-60__ seconds. Do _5___ sessions per day. Repeat using target on pattern background.  Copyright  VHI. All rights reserved.  General Safety Tip Card    1.For safety, all exercises must be performed close to a support (wall, countertop, person, etc.) or in a corner with back of chair in front. 2.Only perform those exercises as instructed by the therapist. If instructions are not clearly understood, wait for clarification by therapist before attempting to perform.  Copyright  VHI. All rights reserved.

## 2016-11-18 NOTE — Discharge Summary (Signed)
Physician Discharge Summary  Linda Hebert EXB:[id number] DOB: Jun 01, 1970 DOA: 11/17/2016  PCP: Merrilee Seashore, MD  Admit date: 11/17/2016 Discharge date: 11/18/2016   Recommendations for Outpatient Follow-Up:   1. Referral given of vestibular PT   Discharge Diagnosis:   Principal Problem:   Vertigo Active Problems:   Nausea & vomiting   Hypokalemia   Discharge disposition:  Home.  Discharge Condition: Improved.  Diet recommendation: Low sodium, heart healthy.  Carbohydrate-modified  Wound care: None.   History of Present Illness:   Linda Hebert  is a 46 y.o. female, w dm2, hx of migraines, brain tumor (benign), apparently presents w dizziness, vertigo starting this am.  Worse at noon   + nausea and vomitting. Movement made it worse.   denies tinnitus, hearing loss, family history.  MRI brain in ED negative for CVA,.  Pt was about to be discharged from ED and experienced more vertigo and n/v.  ED requested that the patient be admitted for intractable vertigo and n/v.       Hospital Course by Problem:   46 year old female with vertigo with findings on physical exam referable to the right peripheral vestibular system consistent with vestibulitis. Neuro recommends: prednisone 60 mg x 5 days followed by decreasing by 10 mg/day for a total of 10 days treatment(e.g 50mg  day 6, 40mg  day 7...etc) PRN meclizine      Medical Consultants:   Neuro  Discharge Exam:   Vitals:   11/18/16 1000 11/18/16 1426  BP: 120/77 123/78  Pulse: 78 81  Resp: 18 17  Temp: 98.1 F (36.7 C) 98.6 F (37 C)  SpO2: 94% 96%   Vitals:   11/18/16 0035 11/18/16 0607 11/18/16 1000 11/18/16 1426  BP: (!) 144/94 119/76 120/77 123/78  Pulse: 71 81 78 81  Resp: 18 18 18 17   Temp: 97.9 F (36.6 C) 98.7 F (37.1 C) 98.1 F (36.7 C) 98.6 F (37 C)  TempSrc: Oral Oral Oral Oral  SpO2: 97% 97% 94% 96%  Weight: 92.8 kg (204 lb 8 oz)     Height: 5\' 5"  (1.651 m)       Gen:   NAD    The results of significant diagnostics from this hospitalization (including imaging, microbiology, ancillary and laboratory) are listed below for reference.     Procedures and Diagnostic Studies:   Ct Head Wo Contrast  Result Date: 11/17/2016 CLINICAL DATA:  Dizziness and vertigo beginning at 5:30 a.m. Nausea and vomiting. EXAM: CT HEAD WITHOUT CONTRAST TECHNIQUE: Contiguous axial images were obtained from the base of the skull through the vertex without intravenous contrast. COMPARISON:  None. FINDINGS: Brain: Remote right frontal ventriculostomy tract is noted. Acute infarct, hemorrhage, or mass lesion is present. There is no significant white matter disease. Ventricles are of normal size otherwise. No significant extraaxial fluid collection is present. Vascular: No hyperdense vessel or unexpected calcification. Skull: Frontal craniotomy is present. Right frontal burr hole is evident. No acute or healing fracture is present. No focal lytic or blastic lesions are present. Sinuses/Orbits: Postsurgical changes of the frontal sinuses are evident bilaterally. There is opacification of the bilateral frontal sinuses. The remaining paranasal sinuses and mastoid air cells are clear. IMPRESSION: 1. No acute intracranial abnormality. 2. Remote right frontal ventriculostomy tract. 3. Frontal craniotomy and frontal sinus surgery with opacification of the residual frontal sinuses bilaterally. Electronically Signed   By: San Morelle M.D.   On: 11/17/2016 15:26   Mr Jeri Cos And Wo Contrast  Result Date: 11/17/2016  CLINICAL DATA:  New onset of dizziness beginning at 5:30 a.m. today. Personal history of brain tumor. EXAM: MRI HEAD WITHOUT AND WITH CONTRAST TECHNIQUE: Multiplanar, multiecho pulse sequences of the brain and surrounding structures were obtained without and with intravenous contrast. CONTRAST:  20 mL MultiHance COMPARISON:  CT head without contrast from the same day and CT head without  contrast 12/09/2004 report of MRI brain 06/12/2002. FINDINGS: Brain: A nonenhancing suprasellar heterogeneous cystic lesion is noted. This corresponds with the previously treated tumor. Lesion measures 10 x 18 x 16 mm. Right frontal ventriculostomy tract is again noted. There is no enhancement along the tract. No acute infarct, hemorrhage, or mass lesion is present. The ventricles are otherwise normal in size. No significant extra-axial fluid collection is present. White matter changes are noted about the frontal horns of the lateral ventricles. The brainstem and cerebellum are normal. Marked thinning of the anterior corpus callosum in genu is noted. Thickening of the dura anteriorly is likely postsurgical. Vascular: Flow is present in the major intracranial arteries. Skull and upper cervical spine: The skullbase is within normal limits. The craniocervical junction is normal. Marrow signal is normal. Sinuses/Orbits: Frontal sinus opacification is noted following surgery. The paranasal sinuses and mastoid air cells are clear. Globes and orbits are within normal limits bilaterally. IMPRESSION: 1. Postsurgical changes of frontal craniotomy including the frontal sinuses. 2. Residual 1.8 cm benign-appearing cystic lesion just above the optic chiasm and third ventricle without significant mass effect. 3. No acute or focal lesion to explain sudden onset of dizziness or vertigo. Electronically Signed   By: San Morelle M.D.   On: 11/17/2016 17:49     Labs:   Basic Metabolic Panel: Recent Labs  Lab 11/17/16 1230 11/18/16 0324  NA 139 137  K 3.1* 4.0  CL 109 110  CO2 19* 18*  GLUCOSE 158* 170*  BUN 9 8  CREATININE 0.84 0.79  CALCIUM 8.8* 9.0   GFR Estimated Creatinine Clearance: 101 mL/min (by C-G formula based on SCr of 0.79 mg/dL). Liver Function Tests: No results for input(s): AST, ALT, ALKPHOS, BILITOT, PROT, ALBUMIN in the last 168 hours. No results for input(s): LIPASE, AMYLASE in the  last 168 hours. No results for input(s): AMMONIA in the last 168 hours. Coagulation profile Recent Labs  Lab 11/17/16 1230  INR 0.95    CBC: Recent Labs  Lab 11/17/16 1230  WBC 7.9  HGB 13.8  HCT 40.8  MCV 90.3  PLT 246   Cardiac Enzymes: No results for input(s): CKTOTAL, CKMB, CKMBINDEX, TROPONINI in the last 168 hours. BNP: Invalid input(s): POCBNP CBG: Recent Labs  Lab 11/17/16 1305  GLUCAP 150*   D-Dimer No results for input(s): DDIMER in the last 72 hours. Hgb A1c No results for input(s): HGBA1C in the last 72 hours. Lipid Profile No results for input(s): CHOL, HDL, LDLCALC, TRIG, CHOLHDL, LDLDIRECT in the last 72 hours. Thyroid function studies No results for input(s): TSH, T4TOTAL, T3FREE, THYROIDAB in the last 72 hours.  Invalid input(s): FREET3 Anemia work up No results for input(s): VITAMINB12, FOLATE, FERRITIN, TIBC, IRON, RETICCTPCT in the last 72 hours. Microbiology No results found for this or any previous visit (from the past 240 hour(s)).   Discharge Instructions:   Discharge Instructions    Diet general   Complete by:  As directed    Increase activity slowly   Complete by:  As directed      Allergies as of 11/18/2016      Reactions  Morphine And Related Nausea Only   GI Upset   Propoxyphene Nausea And Vomiting, Nausea Only   Bactrim Nausea And Vomiting   Sulfamethoxazole-trimethoprim Nausea And Vomiting, Other (See Comments)      Ciprofloxacin Nausea And Vomiting   Codeine Nausea Only      Medication List    STOP taking these medications   cyclobenzaprine 10 MG tablet Commonly known as:  FLEXERIL   meloxicam 7.5 MG tablet Commonly known as:  MOBIC     TAKE these medications   adeks chewable tablet Chew 1 tablet by mouth daily.   calcium carbonate 1500 (600 Ca) MG Tabs tablet Commonly known as:  OSCAL Take 600 mg of elemental calcium daily with breakfast by mouth.   calcium carbonate 500 MG chewable tablet Commonly  known as:  TUMS - dosed in mg elemental calcium Chew 1 tablet by mouth daily.   famotidine 10 MG chewable tablet Commonly known as:  PEPCID AC Chew 10 mg 2 (two) times daily as needed by mouth for heartburn.   fluticasone 50 MCG/ACT nasal spray Commonly known as:  FLONASE Place 2 sprays daily into both nostrils.   levothyroxine 25 MCG tablet Commonly known as:  SYNTHROID, LEVOTHROID Take 25 mcg by mouth daily.   LORazepam 0.5 MG tablet Commonly known as:  ATIVAN Take 0.5 mg by mouth every 6 (six) hours as needed for anxiety (for procedures/MRI).   MAGNESIUM-ZINC PO Take 400 mg by mouth once.   meclizine 25 MG tablet Commonly known as:  ANTIVERT Take 1 tablet (25 mg total) 4 (four) times daily as needed by mouth for dizziness.   metoprolol succinate 25 MG 24 hr tablet Commonly known as:  TOPROL-XL Take 25 mg by mouth daily.   montelukast 10 MG tablet Commonly known as:  SINGULAIR Take 10 mg at bedtime by mouth.   NASONEX 50 MCG/ACT nasal spray Generic drug:  mometasone Place 2 sprays daily as needed into the nose (allergies).   omeprazole 40 MG capsule Commonly known as:  PRILOSEC Take 40 mg daily before breakfast by mouth.   predniSONE 10 MG tablet Commonly known as:  DELTASONE 60 mg x 5 days followed by 50mg  x 1 day then 40 mg x 1 day then 30 mg x 1 day then 20 mg x 1 day then 10 mg x 1 day then stop   RECLAST IV Inject into the vein. Once yearly   SUMAtriptan 20 MG/ACT nasal spray Commonly known as:  IMITREX Place 20 mg as needed into the nose for migraine. Reported on 01/25/2015   SUMAtriptan 6 MG/0.5ML Soaj Inject 0.5 mLs as needed into the muscle (for migraine. May repeat in 1 hour if needed. Do not exceed 73mL in 24-48 hours.). Reported on 01/25/2015   vitamin C 500 MG tablet Commonly known as:  ASCORBIC ACID Take 500 mg by mouth daily.   vitamin E 400 UNIT capsule Take 400 Units by mouth daily.   zonisamide 100 MG capsule Commonly known as:   ZONEGRAN Take 100 mg by mouth 2 (two) times daily.      Follow-up Information    Merrilee Seashore, MD. Schedule an appointment as soon as possible for a visit in 2 day(s).   Specialty:  Internal Medicine Contact information: [address] [place]  [phone]            Time coordinating discharge: 35 min  Signed:  Orphia Mctigue Alison Stalling   [facility] 11/18/2016, 5:26 PM

## 2016-11-19 NOTE — Care Management Note (Signed)
Case Management Note  Patient Details  Name: PRESLEA RHODUS MRN: [id number] Date of Birth: [birth year]  Subjective/Objective:                    Action/Plan: 11/19/2016 at 10:20 am::Pt dischaged home with self care. Pt had PCP, insurance and transportation home. No needs per CM.    Expected Discharge Date:  11/18/16               Expected Discharge Plan:  Home/Self Care  In-House Referral:     Discharge planning Services     Post Acute Care Choice:    Choice offered to:     DME Arranged:    DME Agency:     HH Arranged:    HH Agency:     Status of Service:  Completed, signed off  If discussed at [employer] of Stay Meetings, dates discussed:    Additional Comments:  Pollie Friar, RN 11/19/2016, 10:19 AM

## 2016-11-19 NOTE — Progress Notes (Signed)
PT G-Code Noted Late Entry    12-14-2016 1740  PT G-Codes **NOT FOR INPATIENT CLASS**  Functional Assessment Tool Used AM-PAC 6 Clicks Basic Mobility  Functional Limitation Mobility: Walking and moving around  Mobility: Walking and Moving Around Current Status (P9150) CK  Mobility: Walking and Moving Around Goal Status [phone]) CK  Mobility: Walking and Moving Around Discharge Status [phone]) [place], [place] 11/19/2016

## 2016-11-22 ENCOUNTER — Other Ambulatory Visit: Payer: Self-pay

## 2016-11-22 ENCOUNTER — Encounter (HOSPITAL_COMMUNITY): Payer: Self-pay

## 2016-11-22 ENCOUNTER — Emergency Department (HOSPITAL_COMMUNITY)
Admission: EM | Admit: 2016-11-22 | Discharge: 2016-11-23 | Disposition: A | Discharge status: Home / Self Care | Payer: BLUE CROSS/BLUE SHIELD | Attending: Emergency Medicine | Admitting: Emergency Medicine

## 2016-11-22 DIAGNOSIS — R519 Headache, unspecified: Secondary | ICD-10-CM

## 2016-11-22 DIAGNOSIS — Z79899 Other long term (current) drug therapy: Secondary | ICD-10-CM | POA: Insufficient documentation

## 2016-11-22 DIAGNOSIS — R42 Dizziness and giddiness: Secondary | ICD-10-CM | POA: Diagnosis present

## 2016-11-22 DIAGNOSIS — R51 Headache: Secondary | ICD-10-CM | POA: Diagnosis not present

## 2016-11-22 MED ORDER — LORAZEPAM 2 MG/ML IJ SOLN
1.0000 mg | Freq: Once | INTRAMUSCULAR | Status: AC
  Administered 2016-11-22: 1 mg via INTRAVENOUS
  Filled 2016-11-22: qty 1

## 2016-11-22 MED ORDER — SODIUM CHLORIDE 0.9 % IV BOLUS (SEPSIS)
1000.0000 mL | Freq: Once | INTRAVENOUS | Status: AC
  Administered 2016-11-22: 1000 mL via INTRAVENOUS

## 2016-11-22 MED ORDER — KETOROLAC TROMETHAMINE 30 MG/ML IJ SOLN
15.0000 mg | Freq: Once | INTRAMUSCULAR | Status: AC
  Administered 2016-11-22: 15 mg via INTRAVENOUS
  Filled 2016-11-22: qty 1

## 2016-11-22 MED ORDER — METHYLPREDNISOLONE SODIUM SUCC 125 MG IJ SOLR
125.0000 mg | Freq: Once | INTRAMUSCULAR | Status: AC
  Administered 2016-11-22: 125 mg via INTRAVENOUS
  Filled 2016-11-22: qty 2

## 2016-11-22 NOTE — ED Triage Notes (Signed)
Pt endorses vertigo that has been intermittent for over a week. Pt was seen and admitted here last weekend and d/c Monday and diagnosed with vertigo. Pt states that it got better but has been worse over the last 2 days. Hypertensive in triage. No neuro deficits. AxOx4.

## 2016-11-22 NOTE — ED Provider Notes (Signed)
[facility] EMERGENCY DEPARTMENT Provider Note   CSN: [id number] Arrival date & time: 11/22/16  1612     History   Chief Complaint Chief Complaint  Patient presents with  . Dizziness    HPI Linda Hebert is a 46 y.o. female.  HPI  Presents 1 week after discharge from the facility due to vertigo, now with concern for dizziness, nausea, generalized fatigue. She also complained of headache. She notes that since discharge she was doing generally well, feeling substantially better, but did not fill her prednisone prescription. Today, over the past 24 hours or so she has developed a headache and a typical migraine-like fashion, superior soreness. No new weakness anywhere, no new confusion, disorientation, no vomiting, no diarrhea. She notes that her dizziness compared to last week is substantially better, with symptoms that are not easily provoked, but are persistent, worse with motion.   Past Medical History:  Diagnosis Date  . Brain tumor (benign) ([place])   . Diabetes mellitus   . IBS (irritable bowel syndrome)   . Memory loss   . Migraine     Patient Active Problem List   Diagnosis Date Noted  . Vertigo 11/17/2016  . Nausea & vomiting 11/17/2016  . Hypokalemia 11/17/2016  . Memory loss 12/22/2014  . Hx of craniotomy 12/22/2014  . Type II or unspecified type diabetes mellitus without mention of complication, not stated as uncontrolled 06/07/2013    Past Surgical History:  Procedure Laterality Date  . BRAIN SURGERY     x 2  . heart ablation  2006  . LIVER BIOPSY      OB History    No data available       Home Medications    Prior to Admission medications   Medication Sig Start Date End Date Taking? Authorizing Provider  calcium carbonate (OSCAL) 1500 (600 Ca) MG TABS tablet Take 600 mg of elemental calcium daily with breakfast by mouth.   Yes [provider]  calcium carbonate (TUMS - DOSED IN MG ELEMENTAL CALCIUM) 500 MG  chewable tablet Chew 1 tablet by mouth daily.   Yes [provider]  diazepam (VALIUM) 5 MG tablet Take 5 mg as needed by mouth. 11/17/16  Yes [provider]  famotidine (PEPCID AC) 10 MG chewable tablet Chew 10 mg 2 (two) times daily as needed by mouth for heartburn.    Yes [provider]  fluticasone (FLONASE) 50 MCG/ACT nasal spray Place 2 sprays daily into both nostrils. 10/26/16  Yes [provider]  levothyroxine (SYNTHROID, LEVOTHROID) 25 MCG tablet Take 25 mcg by mouth daily.   Yes [provider]  MAGNESIUM-ZINC PO Take 400 mg by mouth once.   Yes [provider]  meclizine (ANTIVERT) 25 MG tablet Take 1 tablet (25 mg total) 4 (four) times daily as needed by mouth for dizziness. 11/18/16  Yes Eulogio Bear U, DO  metoprolol succinate (TOPROL-XL) 25 MG 24 hr tablet Take 25 mg by mouth daily.   Yes [provider]  mometasone (NASONEX) 50 MCG/ACT nasal spray Place 2 sprays daily as needed into the nose (allergies).    Yes [provider]  montelukast (SINGULAIR) 10 MG tablet Take 10 mg at bedtime by mouth. 11/11/16  Yes [provider]  Multiple Vitamins-Minerals (ADEKS) chewable tablet Chew 1 tablet by mouth daily.    Yes [provider]  omeprazole (PRILOSEC) 40 MG capsule Take 40 mg daily before breakfast by mouth.    Yes [provider]  predniSONE (DELTASONE) 10 MG tablet 60 mg x 5 days followed by 50mg  x 1 day then 40 mg x 1 day then 30 mg x 1 day then 20 mg x 1 day then 10 mg x 1 day then stop 11/18/16  Yes Vann, Jessica U, DO  SUMAtriptan (IMITREX) 20 MG/ACT nasal spray Place 20 mg as needed into the nose for migraine. Reported on 01/25/2015 08/02/14  Yes [provider]  vitamin C (ASCORBIC ACID) 500 MG tablet Take 500 mg by mouth daily.   Yes [provider]  vitamin E 400 UNIT capsule Take 400 Units by mouth daily.   Yes [provider]  Zoledronic Acid (RECLAST  IV) Inject into the vein. Once yearly   Yes [provider]  zonisamide (ZONEGRAN) 100 MG capsule Take 100 mg by mouth 2 (two) times daily.   Yes [provider]    Family History Family History  Problem Relation Age of Onset  . Hypertension Father   . Hypertension Mother   . Parkinson's disease Maternal Grandmother     Social History Social History   Tobacco Use  . Smoking status: Never Smoker  . Smokeless tobacco: Never Used  Substance Use Topics  . Alcohol use: Yes    Alcohol/week: 0.0 oz    Comment: occasional - 1/2 drink per week or less  . Drug use: No     Allergies   Morphine and related; Propoxyphene; Bactrim; Sulfamethoxazole-trimethoprim; Ciprofloxacin; and Codeine   Review of Systems Review of Systems  Constitutional:       Per HPI, otherwise negative  HENT:       Per HPI, otherwise negative  Respiratory:       Per HPI, otherwise negative  Cardiovascular:       Per HPI, otherwise negative  Gastrointestinal: Negative for vomiting.  Endocrine:       Negative aside from HPI  Genitourinary:       Neg aside from HPI   Musculoskeletal:       Per HPI, otherwise negative  Skin: Negative.   Neurological: Positive for dizziness and headaches. Negative for syncope.     Physical Exam Updated Vital Signs BP 126/76 (BP Location: Left Arm)   Pulse 90   Temp 98.5 F (36.9 C) (Oral)   Resp 16   Ht 5\' 5"  (1.651 m)   Wt 92.5 kg (204 lb)   SpO2 94%   BMI 33.95 kg/m   Physical Exam  Constitutional: She is oriented to person, place, and time. She has a sickly appearance. No distress.  HENT:  Head: Normocephalic and atraumatic.  Eyes: Conjunctivae and EOM are normal.  Cardiovascular: Normal rate and regular rhythm.  Pulmonary/Chest: Effort normal and breath sounds normal. No stridor. No respiratory distress.  Abdominal: She exhibits no distension.  Musculoskeletal: She exhibits no edema.  Neurological: She is alert and oriented to  person, place, and time. She displays no atrophy and no tremor. No cranial nerve deficit. She exhibits normal muscle tone. She displays no seizure activity. Coordination normal.  Skin: Skin is warm and dry.  Psychiatric: She has a normal mood and affect.  Nursing note and vitals reviewed.    ED Treatments / Results   Procedures Procedures (including critical care time)  Medications Ordered in ED Medications  sodium chloride 0.9 % bolus 1,000 mL (0 mLs Intravenous Stopped 11/22/16 2328)  ketorolac (TORADOL) 30 MG/ML injection 15 mg (15 mg Intravenous Given 11/22/16 2201)  LORazepam (ATIVAN) injection 1  mg (1 mg Intravenous Given 11/22/16 2202)  methylPREDNISolone sodium succinate (SOLU-MEDROL) 125 mg/2 mL injection 125 mg (125 mg Intravenous Given 11/22/16 2216)     Initial Impression / Assessment and Plan / ED Course  I have reviewed the triage vital signs and the nursing notes.  Pertinent labs & imaging results that were available during my care of the patient were reviewed by me and considered in my medical decision making (see chart for details).  Chart reviewed, notable for recent admission for intractable vertigo. That admission included MRI, CT scan, both generally reassuring.  Update:, Patient substantially better. With no new complaints, improvement of her headache, and with well evaluated vertigo, the patient was encouraged to obtain her recently prescribed steroids that she did not obtain, discharged in stable condition with outpatient neurology and primary care follow-up.   Final Clinical Impressions(s) / ED Diagnoses  Bad headache Dizziness   Carmin Muskrat, MD 11/23/16 0008

## 2016-11-22 NOTE — ED Notes (Signed)
Pt remains in triage waiting. Updated on wait for treatment room.

## 2016-11-23 NOTE — Discharge Instructions (Signed)
As discussed, your evaluation today has been largely reassuring.  But, it is important that you monitor your condition carefully, and do not hesitate to return to the ED if you develop new, or concerning changes in your condition. ? ?Otherwise, please follow-up with your physician for appropriate ongoing care. ? ?

## 2018-10-19 IMAGING — CT CT HEAD W/O CM
3 series · 14 of 47 positions shown, 16 images · non-contrast
Comparison: None.

CLINICAL DATA: Dizziness and vertigo beginning at [DATE] a.m.. Nausea
and vomiting.

EXAM:
CT HEAD WITHOUT CONTRAST
TECHNIQUE: Contiguous axial images were obtained from the base of the skull
through the vertex without intravenous contrast.

[Series 3: head 5.0 h30s · axial · 0.43mm/px · z∈[-124,+11]mm · 8 of 33 slices shown, 10 images]
[im 3/33  brain]
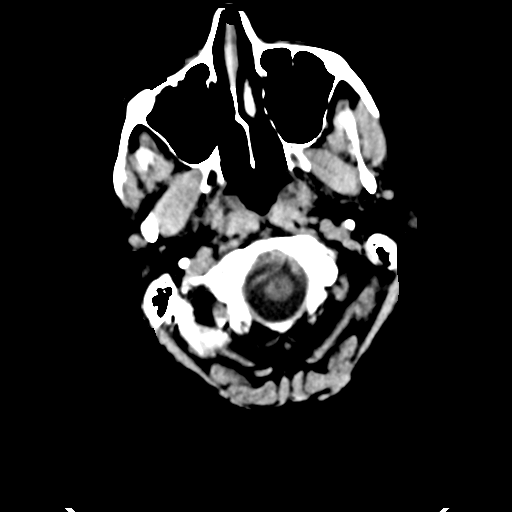
[im 3/33  bone]
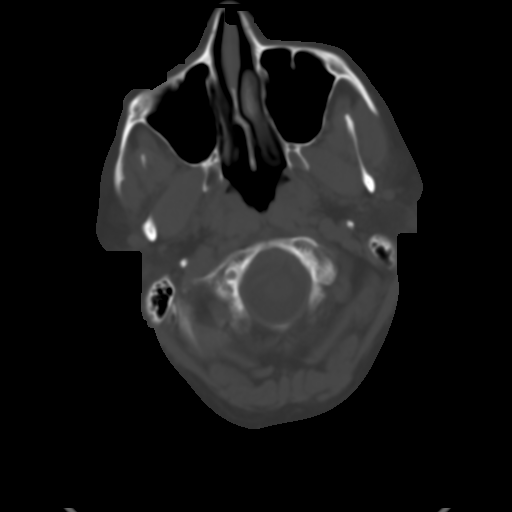
[im 7/33  brain]
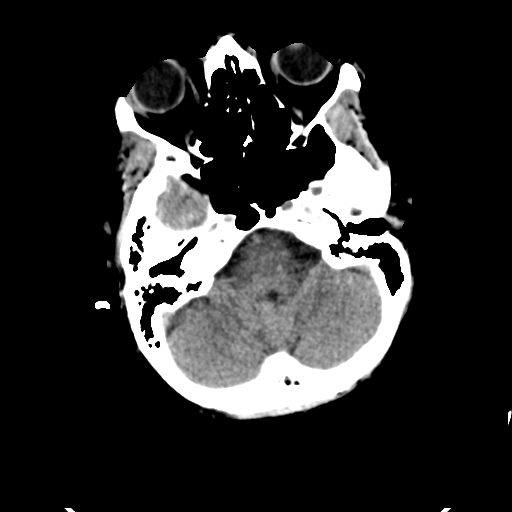
[im 10/33  brain]
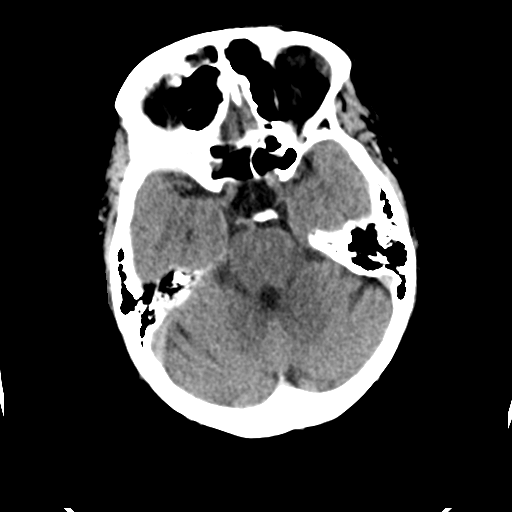
[im 15/33  brain]
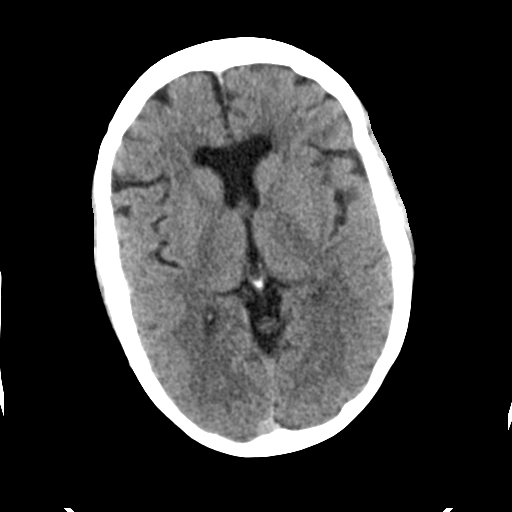
[im 18/33  brain]
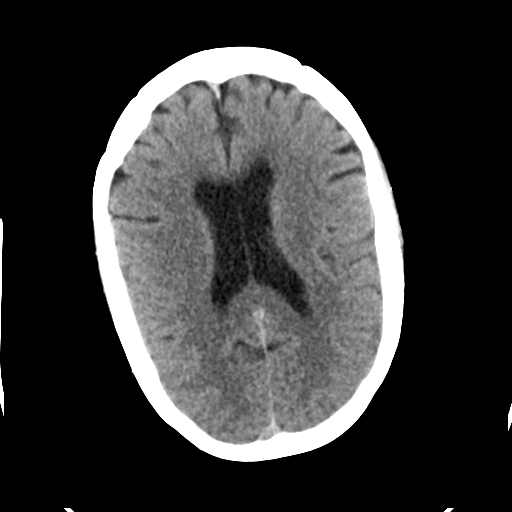
[im 18/33  bone]
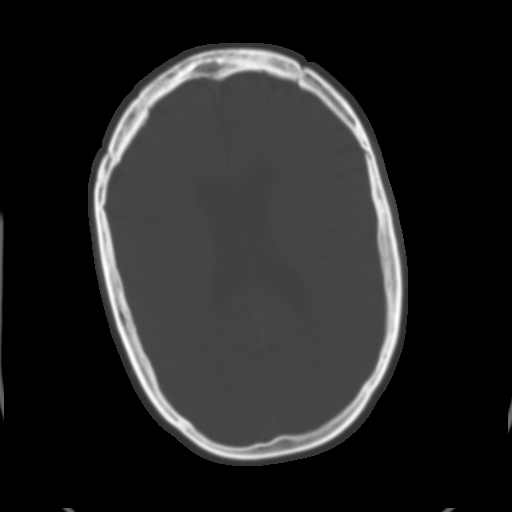
[im 23/33  brain]
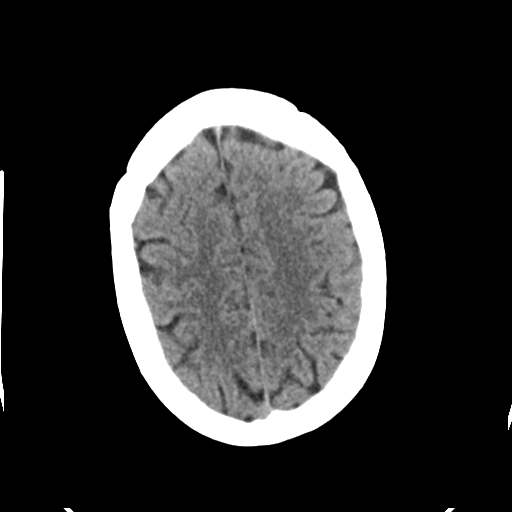
[im 26/33  brain]
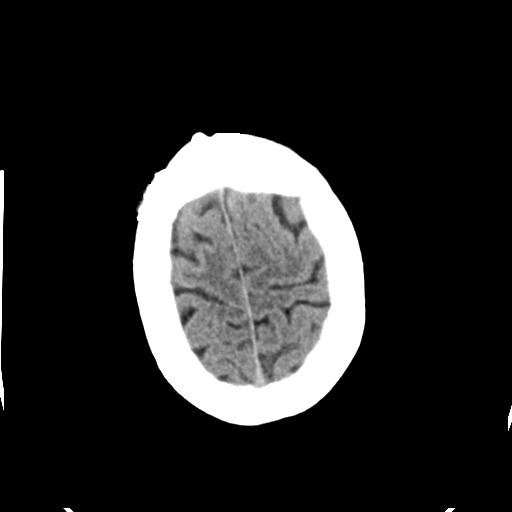
[im 30/33  brain]
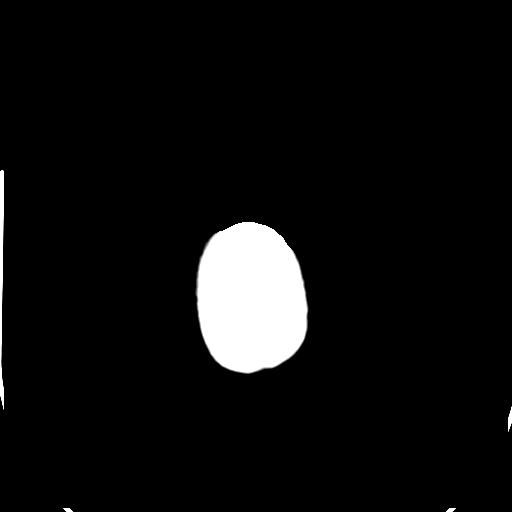

[Series 5: head 3.0 mpr cor · coronal · 0.33mm/px · 3 of 76 slices shown]
[im 26/76  brain]
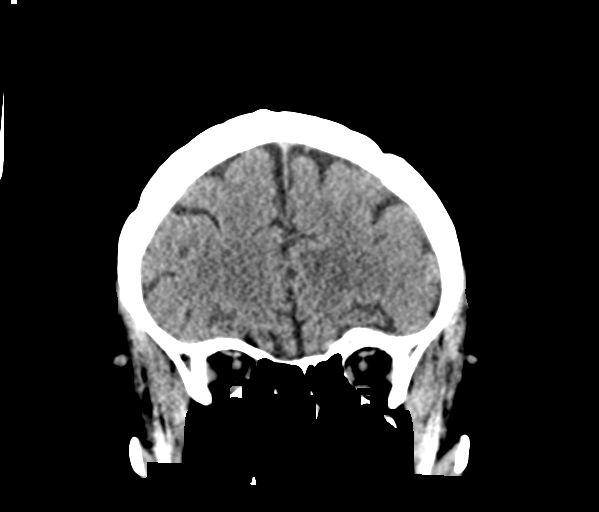
[im 34/76  brain]
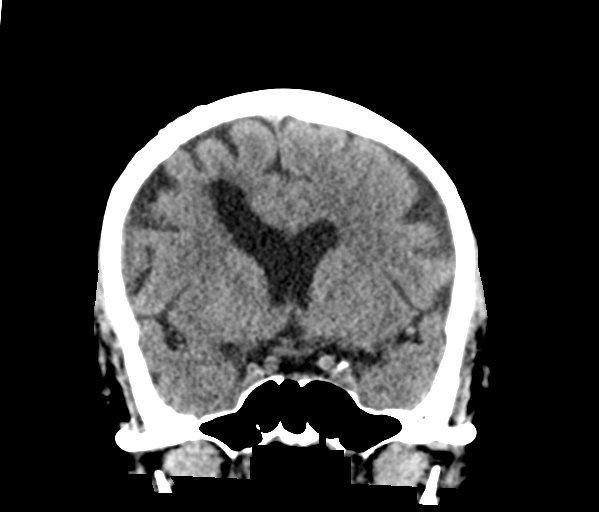
[im 42/76  brain]
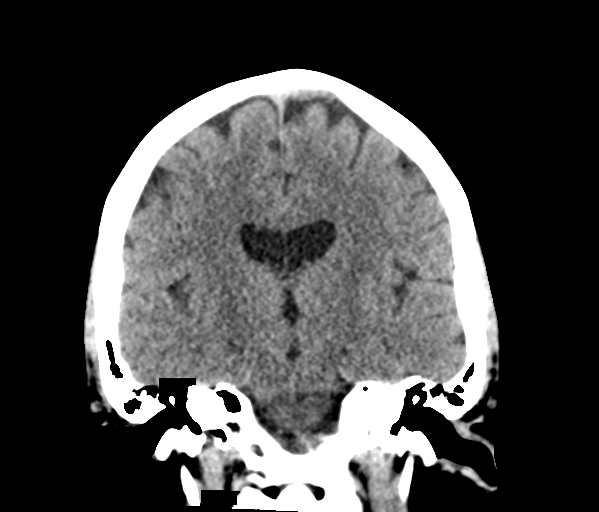

[Series 6: head 3.0 mpr sag · sagittal · 0.32mm/px · 3 of 67 slices shown]
[im 30/67  brain]
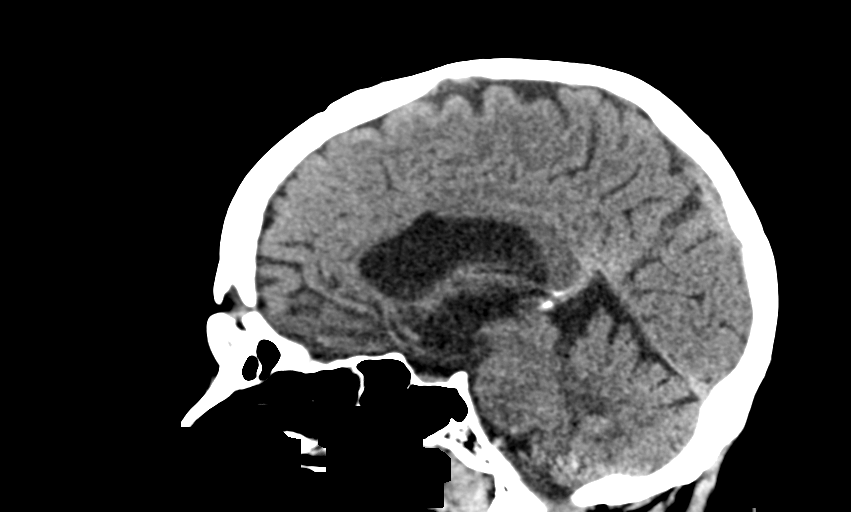
[im 34/67  brain]
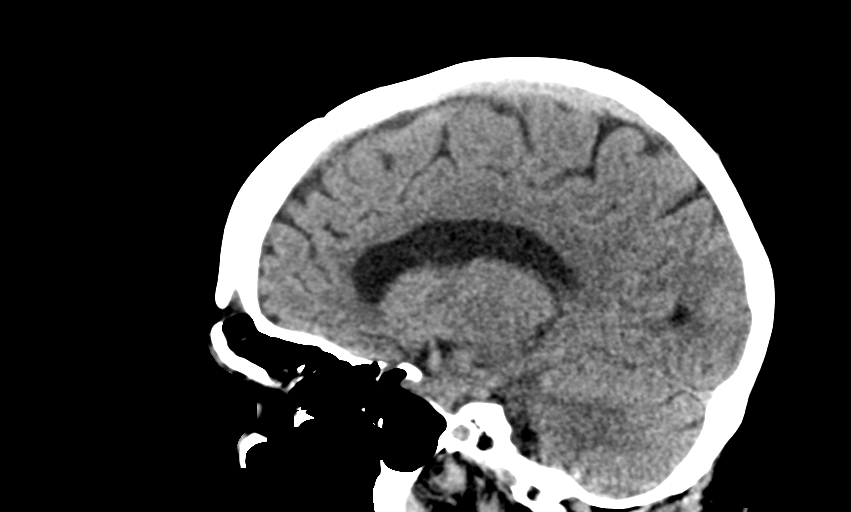
[im 37/67  brain]
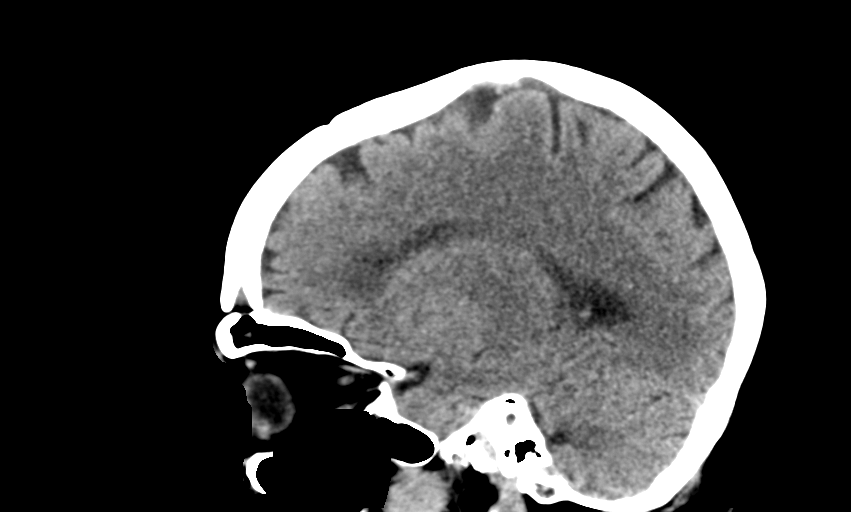

[14 of 47 positions shown; findings below may reference images not displayed]

FINDINGS: Brain: Remote right frontal ventriculostomy tract is noted. Acute
infarct, hemorrhage, or mass lesion is present. There is no
significant white matter disease. Ventricles are of normal size
otherwise. No significant extraaxial fluid collection is present.

Vascular: No hyperdense vessel or unexpected calcification.

Skull: Frontal craniotomy is present. Right frontal burr hole is
evident. No acute or healing fracture is present. No focal lytic or
blastic lesions are present.

Sinuses/Orbits: Postsurgical changes of the frontal sinuses are
evident bilaterally. There is opacification of the bilateral frontal
sinuses. The remaining paranasal sinuses and mastoid air cells are
clear.
IMPRESSION: 1. No acute intracranial abnormality.
2. Remote right frontal ventriculostomy tract.
3. Frontal craniotomy and frontal sinus surgery with opacification
of the residual frontal sinuses bilaterally.

## 2020-05-17 ENCOUNTER — Encounter: Payer: Self-pay | Admitting: *Deleted

## 2020-05-18 ENCOUNTER — Ambulatory Visit (INDEPENDENT_AMBULATORY_CARE_PROVIDER_SITE_OTHER): Payer: 59 | Admitting: Neurology

## 2020-05-18 ENCOUNTER — Encounter: Payer: Self-pay | Admitting: Neurology

## 2020-05-18 VITALS — BP 118/87 | HR 41 | Ht 65.0 in | Wt 215.0 lb

## 2020-05-18 DIAGNOSIS — Z9889 Other specified postprocedural states: Secondary | ICD-10-CM | POA: Diagnosis not present

## 2020-05-18 DIAGNOSIS — R413 Other amnesia: Secondary | ICD-10-CM

## 2020-05-18 DIAGNOSIS — G43709 Chronic migraine without aura, not intractable, without status migrainosus: Secondary | ICD-10-CM | POA: Diagnosis not present

## 2020-05-18 NOTE — Progress Notes (Signed)
Chief Complaint  Patient presents with  . Migraine    "haven't had migraines x 2-3 years, I go to [place],  take zonegran as prevetative, rarely take Imitrex"       ASSESSMENT AND PLAN  Linda Hebert is a 50 y.o. female   History of central neurocytoma resection, followed by radiation therapy  Followed up by [facility] oncologist, following up MRI of the brain with without contrast has been stable Cognitive impairment  Has suffered memory loss since her for right frontal approach craniotomy, radiation therapy, on disability now, overall stable, still driving, History of chronic migraine, currently under good control, under the care of [facility], taking Zonegran 100 mg 2 tablets every night   DIAGNOSTIC DATA (LABS, IMAGING, TESTING) - I reviewed patient records, labs, notes, testing and imaging myself where available.  MRI of the brain with without contrast November 17, 2016: A nonenhancing suprasellar heterogeneous cystic lesion is noted. This corresponds with the previously treated tumor. Lesion measures 10 x 18 x 16 mm. Right frontal ventriculostomy tract is again noted. There is no enhancement along the tract.  Most recent imaging study from [facility] with without contrast February 02, 2019, stable study, status post a central neurocytoma right frontal craniotomy  HISTORICAL  Linda Hebert is a 50 year old female, seen in request by her primary care physician Dr. London Pepper, for evaluation of memory loss, headache, initial evaluation was on May 18, 2020  I reviewed and summarized the referring note.  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 Valentina Shaggy in December 2016, Neuropsychological testing identified mild impairments in the areas of auditory working memory, Technical brewer and expressive language. her performances within the Low Average to Borderline ranges on measures of focused visual attention/processing speed likely represented a decline from her pre-morbid level. These deficits resulted in a substantial decline in her global intellectual functioning to the Borderline range (WAIS-IV Full Scale IQ = 77; 6th percentile) currently as compared to her estimated Average pre-morbid level.    From a functional perspective, her neuropsychological profile would predict that she might be slower than the average person to carry out simple clerical-type tasks that require focused visual attention, struggle to hold on to and manipulate auditory information in short-term memory in order to use it, be prone to misjudge angular relationships, have difficulty assembling or organizing parts or visual details into a meaningful whole, fail to precisely name objects and have difficulty finding words while speaking.   She since went on disability, still driving, denies difficulty handling daily activity, continues suffer mild memory loss, seen [facility] for chronic migraine, which is under good control with zonisamide 100 mg 2 tablets every night, denies focal signs, drove to clinic today, but she is very frustrated, does not know why she is referred to our clinic  REVIEW OF SYSTEMS:  Full 14 system review of systems performed and notable only for as above All other review of systems were negative.  PHYSICAL EXAM:   Vitals:  05/18/20 0921  BP: 118/87  Pulse: (!) 41  Weight: 215 lb  (97.5 kg)  Height: 5\' 5"  (1.651 m)   Not recorded     Body mass index is 35.78 kg/m.  PHYSICAL EXAMNIATION:  Gen: NAD, conversant, well nourised, well groomed                      NEUROLOGICAL EXAM:  MENTAL STATUS: Speech/cognition Awake, alert, oriented to history taking and casual conversation  CRANIAL NERVES: CN II: Visual fields are full to confrontation. Pupils are round equal and briskly reactive to light. CN III, IV, VI: extraocular movement are normal. No ptosis. CN V: Facial sensation is intact to light touch CN VII: Face is symmetric with normal eye closure  CN VIII: Hearing is normal to causal conversation. CN IX, X: Phonation is normal. CN XI: Head turning and shoulder shrug are intact  MOTOR: Moves all 4 extremities without difficulty  COORDINATION: There is no trunk or limb dysmetria noted.  GAIT/STANCE: Posture is normal.  Gait is normal   ALLERGIES: Allergies  Allergen Reactions  . Morphine And Related Nausea Only    GI Upset   . Propoxyphene Nausea And Vomiting and Nausea Only  . Bactrim Nausea And Vomiting  . Sulfamethoxazole-Trimethoprim Nausea And Vomiting and Other (See Comments)       . Amoxicillin-Pot Clavulanate     Other reaction(s): diarrhea  . Ciprofloxacin Nausea And Vomiting  . Other Other (See Comments)    All narcotics GI upset  . Codeine Nausea Only    HOME MEDICATIONS: Current Outpatient Medications  Medication Sig Dispense Refill  . calcium carbonate (OSCAL) 1500 (600 Ca) MG TABS tablet Take 600 mg of elemental calcium daily with breakfast by mouth.    . calcium carbonate (TUMS - DOSED IN MG ELEMENTAL CALCIUM) 500 MG chewable tablet Chew 1 tablet by mouth daily.    . fluticasone (FLONASE) 50 MCG/ACT nasal spray Place 2 sprays daily into both nostrils.  6  . levothyroxine (SYNTHROID, LEVOTHROID) 25 MCG tablet Take 25 mcg by mouth daily.    Marland Kitchen MAGNESIUM-ZINC PO Take 400 mg by mouth once.    . meloxicam (MOBIC) 15 MG  tablet Take 1 tablet by mouth daily.    . metoprolol succinate (TOPROL-XL) 25 MG 24 hr tablet Take 25 mg by mouth daily.    . mometasone (NASONEX) 50 MCG/ACT nasal spray Place 2 sprays daily as needed into the nose (allergies).     . montelukast (SINGULAIR) 10 MG tablet Take 10 mg at bedtime by mouth.  3  . Multiple Vitamins-Minerals (ADEKS) chewable tablet Chew 1 tablet by mouth daily.     Marland Kitchen omeprazole (PRILOSEC) 40 MG capsule Take 40 mg daily before breakfast by mouth.     . vitamin C (ASCORBIC ACID) 500 MG tablet Take 500 mg by mouth daily.    . vitamin E 400 UNIT capsule Take 400 Units by mouth daily.    . Zoledronic Acid (RECLAST IV) Inject into the vein. Once yearly    . zonisamide (ZONEGRAN) 100 MG capsule Take 100 mg by mouth 2 (two) times daily.    . diazepam (VALIUM) 5 MG tablet Take 5 mg as needed by mouth. (Patient not taking: Reported on 05/18/2020)  0  . famotidine (PEPCID AC) 10 MG chewable tablet Chew 10 mg 2 (two) times daily as needed by mouth for heartburn.  (Patient not taking: Reported on 05/18/2020)    . meclizine (ANTIVERT) 25 MG  tablet Take 1 tablet (25 mg total) 4 (four) times daily as needed by mouth for dizziness. (Patient not taking: Reported on 05/18/2020) 30 tablet 0  . SUMAtriptan (IMITREX) 20 MG/ACT nasal spray Place 20 mg as needed into the nose for migraine. Reported on 01/25/2015 (Patient not taking: Reported on 05/18/2020)  1   No current facility-administered medications for this visit.    PAST MEDICAL HISTORY: Past Medical History:  Diagnosis Date  . Acid reflux   . Brain tumor (benign) ([place])   . Diabetes mellitus   . Hypertension   . Hypothyroidism   . IBS (irritable bowel syndrome)   . Memory loss   . Migraine     PAST SURGICAL HISTORY: Past Surgical History:  Procedure Laterality Date  . BRAIN SURGERY     x 2  . heart ablation  2006  . LIVER BIOPSY      FAMILY HISTORY: Family History  Problem Relation Age of Onset  . Hypertension Father   .  Hypertension Mother   . Parkinson's disease Maternal Grandmother     SOCIAL HISTORY: Social History   Socioeconomic History  . Marital status: Married    Spouse name: Not on file  . Number of children: 0  . Years of education: 72  . Highest education level: Not on file  Occupational History  . Occupation: Out of work on disability  Tobacco Use  . Smoking status: Never Smoker  . Smokeless tobacco: Never Used  Substance and Sexual Activity  . Alcohol use: Yes    Alcohol/week: 0.0 standard drinks    Comment: occasional - 1/2 drink per week or less  . Drug use: No  . Sexual activity: Not on file  Other Topics Concern  . Not on file  Social History Narrative   Lives at home with her husband.   Left-handed.   Occasional caffeine use.   Social Determinants of Health   Financial Resource Strain: Not on file  Food Insecurity: Not on file  Transportation Needs: Not on file  Physical Activity: Not on file  Stress: Not on file  Social Connections: Not on file  Intimate Partner Violence: Not on file      Marcial Pacas, M.D. Ph.D.  [facility] [address] [place], [place] [address] Ph: [phone] Fax: [phone]  CC:  London Pepper, MD [place],  [place] [address]  London Pepper, MD

## 2020-08-15 DIAGNOSIS — Z9889 Other specified postprocedural states: Secondary | ICD-10-CM | POA: Diagnosis not present

## 2020-08-15 DIAGNOSIS — C715 Malignant neoplasm of cerebral ventricle: Secondary | ICD-10-CM | POA: Diagnosis not present

## 2020-08-15 DIAGNOSIS — G939 Disorder of brain, unspecified: Secondary | ICD-10-CM | POA: Diagnosis not present

## 2020-08-15 DIAGNOSIS — R413 Other amnesia: Secondary | ICD-10-CM | POA: Diagnosis not present

## 2020-08-15 DIAGNOSIS — Z923 Personal history of irradiation: Secondary | ICD-10-CM | POA: Diagnosis not present

## 2020-08-15 DIAGNOSIS — Z85841 Personal history of malignant neoplasm of brain: Secondary | ICD-10-CM | POA: Diagnosis not present

## 2020-08-15 DIAGNOSIS — Z08 Encounter for follow-up examination after completed treatment for malignant neoplasm: Secondary | ICD-10-CM | POA: Diagnosis not present

## 2020-08-23 DIAGNOSIS — M545 Low back pain, unspecified: Secondary | ICD-10-CM | POA: Diagnosis not present

## 2020-08-23 DIAGNOSIS — E119 Type 2 diabetes mellitus without complications: Secondary | ICD-10-CM | POA: Diagnosis not present

## 2020-08-23 DIAGNOSIS — R7303 Prediabetes: Secondary | ICD-10-CM | POA: Diagnosis not present

## 2020-08-23 DIAGNOSIS — I10 Essential (primary) hypertension: Secondary | ICD-10-CM | POA: Diagnosis not present

## 2020-08-24 DIAGNOSIS — M5489 Other dorsalgia: Secondary | ICD-10-CM | POA: Diagnosis not present

## 2020-08-24 DIAGNOSIS — D332 Benign neoplasm of brain, unspecified: Secondary | ICD-10-CM | POA: Diagnosis not present

## 2020-09-07 DIAGNOSIS — H524 Presbyopia: Secondary | ICD-10-CM | POA: Diagnosis not present

## 2020-09-12 DIAGNOSIS — H524 Presbyopia: Secondary | ICD-10-CM | POA: Diagnosis not present

## 2020-09-20 DIAGNOSIS — M5489 Other dorsalgia: Secondary | ICD-10-CM | POA: Diagnosis not present

## 2020-09-20 DIAGNOSIS — D332 Benign neoplasm of brain, unspecified: Secondary | ICD-10-CM | POA: Diagnosis not present

## 2020-09-26 DIAGNOSIS — E039 Hypothyroidism, unspecified: Secondary | ICD-10-CM | POA: Diagnosis not present

## 2020-09-26 DIAGNOSIS — I10 Essential (primary) hypertension: Secondary | ICD-10-CM | POA: Diagnosis not present

## 2020-09-26 DIAGNOSIS — E119 Type 2 diabetes mellitus without complications: Secondary | ICD-10-CM | POA: Diagnosis not present

## 2020-09-26 DIAGNOSIS — M5440 Lumbago with sciatica, unspecified side: Secondary | ICD-10-CM | POA: Diagnosis not present

## 2020-10-19 DIAGNOSIS — M5440 Lumbago with sciatica, unspecified side: Secondary | ICD-10-CM | POA: Diagnosis not present

## 2020-10-23 DIAGNOSIS — M5440 Lumbago with sciatica, unspecified side: Secondary | ICD-10-CM | POA: Diagnosis not present

## 2020-10-24 DIAGNOSIS — Z23 Encounter for immunization: Secondary | ICD-10-CM | POA: Diagnosis not present

## 2020-10-24 DIAGNOSIS — R220 Localized swelling, mass and lump, head: Secondary | ICD-10-CM | POA: Diagnosis not present

## 2020-10-24 DIAGNOSIS — M5459 Other low back pain: Secondary | ICD-10-CM | POA: Diagnosis not present

## 2020-10-25 DIAGNOSIS — M5440 Lumbago with sciatica, unspecified side: Secondary | ICD-10-CM | POA: Diagnosis not present

## 2020-10-31 DIAGNOSIS — M5440 Lumbago with sciatica, unspecified side: Secondary | ICD-10-CM | POA: Diagnosis not present

## 2020-11-01 DIAGNOSIS — Z1231 Encounter for screening mammogram for malignant neoplasm of breast: Secondary | ICD-10-CM | POA: Diagnosis not present

## 2020-11-01 DIAGNOSIS — Z01419 Encounter for gynecological examination (general) (routine) without abnormal findings: Secondary | ICD-10-CM | POA: Diagnosis not present

## 2020-11-01 DIAGNOSIS — Z779 Other contact with and (suspected) exposures hazardous to health: Secondary | ICD-10-CM | POA: Diagnosis not present

## 2020-11-01 DIAGNOSIS — Z6835 Body mass index (BMI) 35.0-35.9, adult: Secondary | ICD-10-CM | POA: Diagnosis not present

## 2020-11-02 DIAGNOSIS — E119 Type 2 diabetes mellitus without complications: Secondary | ICD-10-CM | POA: Diagnosis not present

## 2020-11-02 DIAGNOSIS — E039 Hypothyroidism, unspecified: Secondary | ICD-10-CM | POA: Diagnosis not present

## 2020-11-02 DIAGNOSIS — M5440 Lumbago with sciatica, unspecified side: Secondary | ICD-10-CM | POA: Diagnosis not present

## 2020-11-02 DIAGNOSIS — D443 Neoplasm of uncertain behavior of pituitary gland: Secondary | ICD-10-CM | POA: Diagnosis not present

## 2020-11-02 DIAGNOSIS — M8580 Other specified disorders of bone density and structure, unspecified site: Secondary | ICD-10-CM | POA: Diagnosis not present

## 2020-11-02 DIAGNOSIS — E221 Hyperprolactinemia: Secondary | ICD-10-CM | POA: Diagnosis not present

## 2020-11-02 DIAGNOSIS — Z01419 Encounter for gynecological examination (general) (routine) without abnormal findings: Secondary | ICD-10-CM | POA: Diagnosis not present

## 2020-11-06 DIAGNOSIS — M5440 Lumbago with sciatica, unspecified side: Secondary | ICD-10-CM | POA: Diagnosis not present

## 2020-11-08 DIAGNOSIS — M5440 Lumbago with sciatica, unspecified side: Secondary | ICD-10-CM | POA: Diagnosis not present

## 2020-11-09 DIAGNOSIS — E221 Hyperprolactinemia: Secondary | ICD-10-CM | POA: Diagnosis not present

## 2020-11-09 DIAGNOSIS — E039 Hypothyroidism, unspecified: Secondary | ICD-10-CM | POA: Diagnosis not present

## 2020-11-09 DIAGNOSIS — D443 Neoplasm of uncertain behavior of pituitary gland: Secondary | ICD-10-CM | POA: Diagnosis not present

## 2020-11-09 DIAGNOSIS — E119 Type 2 diabetes mellitus without complications: Secondary | ICD-10-CM | POA: Diagnosis not present

## 2020-11-09 DIAGNOSIS — I10 Essential (primary) hypertension: Secondary | ICD-10-CM | POA: Diagnosis not present

## 2020-11-09 DIAGNOSIS — M899 Disorder of bone, unspecified: Secondary | ICD-10-CM | POA: Diagnosis not present

## 2020-11-14 DIAGNOSIS — M5440 Lumbago with sciatica, unspecified side: Secondary | ICD-10-CM | POA: Diagnosis not present

## 2020-11-16 DIAGNOSIS — M5440 Lumbago with sciatica, unspecified side: Secondary | ICD-10-CM | POA: Diagnosis not present

## 2020-11-20 DIAGNOSIS — M5440 Lumbago with sciatica, unspecified side: Secondary | ICD-10-CM | POA: Diagnosis not present

## 2020-11-23 DIAGNOSIS — M5440 Lumbago with sciatica, unspecified side: Secondary | ICD-10-CM | POA: Diagnosis not present

## 2020-11-27 DIAGNOSIS — M5440 Lumbago with sciatica, unspecified side: Secondary | ICD-10-CM | POA: Diagnosis not present

## 2020-11-29 DIAGNOSIS — M5440 Lumbago with sciatica, unspecified side: Secondary | ICD-10-CM | POA: Diagnosis not present

## 2020-12-05 DIAGNOSIS — M5440 Lumbago with sciatica, unspecified side: Secondary | ICD-10-CM | POA: Diagnosis not present

## 2020-12-11 DIAGNOSIS — M5440 Lumbago with sciatica, unspecified side: Secondary | ICD-10-CM | POA: Diagnosis not present

## 2020-12-13 DIAGNOSIS — M5440 Lumbago with sciatica, unspecified side: Secondary | ICD-10-CM | POA: Diagnosis not present

## 2020-12-20 DIAGNOSIS — M5440 Lumbago with sciatica, unspecified side: Secondary | ICD-10-CM | POA: Diagnosis not present

## 2021-01-31 DIAGNOSIS — D692 Other nonthrombocytopenic purpura: Secondary | ICD-10-CM | POA: Diagnosis not present

## 2021-01-31 DIAGNOSIS — E039 Hypothyroidism, unspecified: Secondary | ICD-10-CM | POA: Diagnosis not present

## 2021-01-31 DIAGNOSIS — M545 Low back pain, unspecified: Secondary | ICD-10-CM | POA: Diagnosis not present

## 2021-01-31 DIAGNOSIS — M5440 Lumbago with sciatica, unspecified side: Secondary | ICD-10-CM | POA: Diagnosis not present

## 2021-01-31 DIAGNOSIS — Z0000 Encounter for general adult medical examination without abnormal findings: Secondary | ICD-10-CM | POA: Diagnosis not present

## 2021-01-31 DIAGNOSIS — L309 Dermatitis, unspecified: Secondary | ICD-10-CM | POA: Diagnosis not present

## 2021-01-31 DIAGNOSIS — I10 Essential (primary) hypertension: Secondary | ICD-10-CM | POA: Diagnosis not present

## 2021-01-31 DIAGNOSIS — E119 Type 2 diabetes mellitus without complications: Secondary | ICD-10-CM | POA: Diagnosis not present

## 2021-02-06 DIAGNOSIS — M5432 Sciatica, left side: Secondary | ICD-10-CM | POA: Diagnosis not present

## 2021-02-06 DIAGNOSIS — Z85841 Personal history of malignant neoplasm of brain: Secondary | ICD-10-CM | POA: Diagnosis not present

## 2021-02-06 DIAGNOSIS — M5116 Intervertebral disc disorders with radiculopathy, lumbar region: Secondary | ICD-10-CM | POA: Diagnosis not present

## 2021-02-07 DIAGNOSIS — R69 Illness, unspecified: Secondary | ICD-10-CM | POA: Diagnosis not present

## 2021-02-07 DIAGNOSIS — J309 Allergic rhinitis, unspecified: Secondary | ICD-10-CM | POA: Diagnosis not present

## 2021-02-07 DIAGNOSIS — D443 Neoplasm of uncertain behavior of pituitary gland: Secondary | ICD-10-CM | POA: Diagnosis not present

## 2021-02-07 DIAGNOSIS — F5101 Primary insomnia: Secondary | ICD-10-CM | POA: Diagnosis not present

## 2021-02-07 DIAGNOSIS — E221 Hyperprolactinemia: Secondary | ICD-10-CM | POA: Diagnosis not present

## 2021-02-07 DIAGNOSIS — I10 Essential (primary) hypertension: Secondary | ICD-10-CM | POA: Diagnosis not present

## 2021-02-07 DIAGNOSIS — Z0000 Encounter for general adult medical examination without abnormal findings: Secondary | ICD-10-CM | POA: Diagnosis not present

## 2021-02-07 DIAGNOSIS — E039 Hypothyroidism, unspecified: Secondary | ICD-10-CM | POA: Diagnosis not present

## 2021-02-07 DIAGNOSIS — N182 Chronic kidney disease, stage 2 (mild): Secondary | ICD-10-CM | POA: Diagnosis not present

## 2021-02-07 DIAGNOSIS — E1169 Type 2 diabetes mellitus with other specified complication: Secondary | ICD-10-CM | POA: Diagnosis not present

## 2021-02-16 DIAGNOSIS — Z20822 Contact with and (suspected) exposure to covid-19: Secondary | ICD-10-CM | POA: Diagnosis not present

## 2021-02-17 DIAGNOSIS — Z20822 Contact with and (suspected) exposure to covid-19: Secondary | ICD-10-CM | POA: Diagnosis not present

## 2021-03-06 DIAGNOSIS — M5442 Lumbago with sciatica, left side: Secondary | ICD-10-CM | POA: Diagnosis not present

## 2021-03-06 DIAGNOSIS — G8929 Other chronic pain: Secondary | ICD-10-CM | POA: Diagnosis not present

## 2021-03-13 DIAGNOSIS — M5442 Lumbago with sciatica, left side: Secondary | ICD-10-CM | POA: Diagnosis not present

## 2021-03-13 DIAGNOSIS — G8929 Other chronic pain: Secondary | ICD-10-CM | POA: Diagnosis not present

## 2021-03-13 DIAGNOSIS — M5117 Intervertebral disc disorders with radiculopathy, lumbosacral region: Secondary | ICD-10-CM | POA: Diagnosis not present

## 2021-03-13 DIAGNOSIS — M533 Sacrococcygeal disorders, not elsewhere classified: Secondary | ICD-10-CM | POA: Diagnosis not present

## 2021-03-16 DIAGNOSIS — M5117 Intervertebral disc disorders with radiculopathy, lumbosacral region: Secondary | ICD-10-CM | POA: Diagnosis not present

## 2021-03-16 DIAGNOSIS — E119 Type 2 diabetes mellitus without complications: Secondary | ICD-10-CM | POA: Diagnosis not present

## 2021-03-23 DIAGNOSIS — Z0389 Encounter for observation for other suspected diseases and conditions ruled out: Secondary | ICD-10-CM | POA: Diagnosis not present

## 2021-04-03 DIAGNOSIS — M533 Sacrococcygeal disorders, not elsewhere classified: Secondary | ICD-10-CM | POA: Diagnosis not present

## 2021-04-03 DIAGNOSIS — M5442 Lumbago with sciatica, left side: Secondary | ICD-10-CM | POA: Diagnosis not present

## 2021-04-03 DIAGNOSIS — M48062 Spinal stenosis, lumbar region with neurogenic claudication: Secondary | ICD-10-CM | POA: Diagnosis not present

## 2021-04-03 DIAGNOSIS — G8929 Other chronic pain: Secondary | ICD-10-CM | POA: Diagnosis not present

## 2021-06-25 DIAGNOSIS — M48062 Spinal stenosis, lumbar region with neurogenic claudication: Secondary | ICD-10-CM | POA: Diagnosis not present

## 2021-06-25 DIAGNOSIS — E119 Type 2 diabetes mellitus without complications: Secondary | ICD-10-CM | POA: Diagnosis not present

## 2021-07-10 DIAGNOSIS — M48062 Spinal stenosis, lumbar region with neurogenic claudication: Secondary | ICD-10-CM | POA: Diagnosis not present

## 2021-07-10 DIAGNOSIS — M5442 Lumbago with sciatica, left side: Secondary | ICD-10-CM | POA: Diagnosis not present

## 2021-07-10 DIAGNOSIS — G8929 Other chronic pain: Secondary | ICD-10-CM | POA: Diagnosis not present

## 2021-07-18 DIAGNOSIS — E119 Type 2 diabetes mellitus without complications: Secondary | ICD-10-CM | POA: Diagnosis not present

## 2021-07-18 DIAGNOSIS — I10 Essential (primary) hypertension: Secondary | ICD-10-CM | POA: Diagnosis not present

## 2021-07-25 DIAGNOSIS — E039 Hypothyroidism, unspecified: Secondary | ICD-10-CM | POA: Diagnosis not present

## 2021-07-25 DIAGNOSIS — E221 Hyperprolactinemia: Secondary | ICD-10-CM | POA: Diagnosis not present

## 2021-07-25 DIAGNOSIS — Q2111 Secundum atrial septal defect: Secondary | ICD-10-CM | POA: Diagnosis not present

## 2021-07-25 DIAGNOSIS — N182 Chronic kidney disease, stage 2 (mild): Secondary | ICD-10-CM | POA: Diagnosis not present

## 2021-07-25 DIAGNOSIS — I10 Essential (primary) hypertension: Secondary | ICD-10-CM | POA: Diagnosis not present

## 2021-07-25 DIAGNOSIS — D443 Neoplasm of uncertain behavior of pituitary gland: Secondary | ICD-10-CM | POA: Diagnosis not present

## 2021-07-25 DIAGNOSIS — E1169 Type 2 diabetes mellitus with other specified complication: Secondary | ICD-10-CM | POA: Diagnosis not present

## 2021-08-07 DIAGNOSIS — M48062 Spinal stenosis, lumbar region with neurogenic claudication: Secondary | ICD-10-CM | POA: Diagnosis not present

## 2021-08-21 DIAGNOSIS — M48062 Spinal stenosis, lumbar region with neurogenic claudication: Secondary | ICD-10-CM | POA: Diagnosis not present

## 2021-08-21 DIAGNOSIS — M5442 Lumbago with sciatica, left side: Secondary | ICD-10-CM | POA: Diagnosis not present

## 2021-08-21 DIAGNOSIS — M533 Sacrococcygeal disorders, not elsewhere classified: Secondary | ICD-10-CM | POA: Diagnosis not present

## 2021-08-21 DIAGNOSIS — G8929 Other chronic pain: Secondary | ICD-10-CM | POA: Diagnosis not present

## 2021-09-18 ENCOUNTER — Other Ambulatory Visit: Payer: Self-pay | Admitting: Obstetrics and Gynecology

## 2021-09-18 DIAGNOSIS — N644 Mastodynia: Secondary | ICD-10-CM

## 2021-09-18 DIAGNOSIS — N6459 Other signs and symptoms in breast: Secondary | ICD-10-CM | POA: Diagnosis not present

## 2021-09-19 ENCOUNTER — Ambulatory Visit: Payer: BLUE CROSS/BLUE SHIELD

## 2021-09-19 ENCOUNTER — Ambulatory Visit
Admission: RE | Admit: 2021-09-19 | Discharge: 2021-09-19 | Disposition: A | Discharge status: Home / Self Care | Payer: Medicare HMO | Source: Ambulatory Visit | Attending: Obstetrics and Gynecology | Admitting: Obstetrics and Gynecology

## 2021-09-19 ENCOUNTER — Other Ambulatory Visit: Payer: Self-pay | Admitting: Obstetrics and Gynecology

## 2021-09-19 DIAGNOSIS — N644 Mastodynia: Secondary | ICD-10-CM

## 2021-10-02 DIAGNOSIS — E221 Hyperprolactinemia: Secondary | ICD-10-CM | POA: Diagnosis not present

## 2021-10-02 DIAGNOSIS — D443 Neoplasm of uncertain behavior of pituitary gland: Secondary | ICD-10-CM | POA: Diagnosis not present

## 2021-10-02 DIAGNOSIS — E1169 Type 2 diabetes mellitus with other specified complication: Secondary | ICD-10-CM | POA: Diagnosis not present

## 2021-10-02 DIAGNOSIS — E039 Hypothyroidism, unspecified: Secondary | ICD-10-CM | POA: Diagnosis not present

## 2021-10-02 DIAGNOSIS — I10 Essential (primary) hypertension: Secondary | ICD-10-CM | POA: Diagnosis not present

## 2021-10-02 DIAGNOSIS — N182 Chronic kidney disease, stage 2 (mild): Secondary | ICD-10-CM | POA: Diagnosis not present

## 2021-11-07 DIAGNOSIS — N951 Menopausal and female climacteric states: Secondary | ICD-10-CM | POA: Diagnosis not present

## 2021-11-07 DIAGNOSIS — Z01419 Encounter for gynecological examination (general) (routine) without abnormal findings: Secondary | ICD-10-CM | POA: Diagnosis not present

## 2021-11-07 DIAGNOSIS — Z124 Encounter for screening for malignant neoplasm of cervix: Secondary | ICD-10-CM | POA: Diagnosis not present

## 2021-11-07 DIAGNOSIS — Z6833 Body mass index (BMI) 33.0-33.9, adult: Secondary | ICD-10-CM | POA: Diagnosis not present

## 2021-11-09 DIAGNOSIS — E039 Hypothyroidism, unspecified: Secondary | ICD-10-CM | POA: Diagnosis not present

## 2021-11-09 DIAGNOSIS — D443 Neoplasm of uncertain behavior of pituitary gland: Secondary | ICD-10-CM | POA: Diagnosis not present

## 2021-11-09 DIAGNOSIS — E119 Type 2 diabetes mellitus without complications: Secondary | ICD-10-CM | POA: Diagnosis not present

## 2021-11-09 DIAGNOSIS — E221 Hyperprolactinemia: Secondary | ICD-10-CM | POA: Diagnosis not present

## 2021-11-14 DIAGNOSIS — M8580 Other specified disorders of bone density and structure, unspecified site: Secondary | ICD-10-CM | POA: Diagnosis not present

## 2021-11-14 DIAGNOSIS — D443 Neoplasm of uncertain behavior of pituitary gland: Secondary | ICD-10-CM | POA: Diagnosis not present

## 2021-11-14 DIAGNOSIS — E039 Hypothyroidism, unspecified: Secondary | ICD-10-CM | POA: Diagnosis not present

## 2021-11-14 DIAGNOSIS — M8589 Other specified disorders of bone density and structure, multiple sites: Secondary | ICD-10-CM | POA: Diagnosis not present

## 2021-11-14 DIAGNOSIS — E1169 Type 2 diabetes mellitus with other specified complication: Secondary | ICD-10-CM | POA: Diagnosis not present

## 2021-11-14 DIAGNOSIS — E221 Hyperprolactinemia: Secondary | ICD-10-CM | POA: Diagnosis not present

## 2021-11-14 DIAGNOSIS — I10 Essential (primary) hypertension: Secondary | ICD-10-CM | POA: Diagnosis not present

## 2021-11-19 DIAGNOSIS — I10 Essential (primary) hypertension: Secondary | ICD-10-CM | POA: Diagnosis not present

## 2021-11-19 DIAGNOSIS — N182 Chronic kidney disease, stage 2 (mild): Secondary | ICD-10-CM | POA: Diagnosis not present

## 2021-11-19 DIAGNOSIS — E1169 Type 2 diabetes mellitus with other specified complication: Secondary | ICD-10-CM | POA: Diagnosis not present

## 2021-11-19 DIAGNOSIS — Z23 Encounter for immunization: Secondary | ICD-10-CM | POA: Diagnosis not present

## 2021-11-20 DIAGNOSIS — N72 Inflammatory disease of cervix uteri: Secondary | ICD-10-CM | POA: Diagnosis not present

## 2021-11-20 DIAGNOSIS — N912 Amenorrhea, unspecified: Secondary | ICD-10-CM | POA: Diagnosis not present

## 2021-11-27 DIAGNOSIS — E119 Type 2 diabetes mellitus without complications: Secondary | ICD-10-CM | POA: Diagnosis not present

## 2021-11-27 DIAGNOSIS — M533 Sacrococcygeal disorders, not elsewhere classified: Secondary | ICD-10-CM | POA: Diagnosis not present

## 2021-12-11 DIAGNOSIS — M533 Sacrococcygeal disorders, not elsewhere classified: Secondary | ICD-10-CM | POA: Diagnosis not present

## 2021-12-11 DIAGNOSIS — M5442 Lumbago with sciatica, left side: Secondary | ICD-10-CM | POA: Diagnosis not present

## 2021-12-11 DIAGNOSIS — M48062 Spinal stenosis, lumbar region with neurogenic claudication: Secondary | ICD-10-CM | POA: Diagnosis not present

## 2021-12-11 DIAGNOSIS — G8929 Other chronic pain: Secondary | ICD-10-CM | POA: Diagnosis not present

## 2022-02-20 DIAGNOSIS — Z0000 Encounter for general adult medical examination without abnormal findings: Secondary | ICD-10-CM | POA: Diagnosis not present

## 2022-02-20 DIAGNOSIS — E119 Type 2 diabetes mellitus without complications: Secondary | ICD-10-CM | POA: Diagnosis not present

## 2022-02-22 DIAGNOSIS — D332 Benign neoplasm of brain, unspecified: Secondary | ICD-10-CM | POA: Diagnosis not present

## 2022-02-22 DIAGNOSIS — C715 Malignant neoplasm of cerebral ventricle: Secondary | ICD-10-CM | POA: Diagnosis not present

## 2022-02-27 DIAGNOSIS — Z0000 Encounter for general adult medical examination without abnormal findings: Secondary | ICD-10-CM | POA: Diagnosis not present

## 2022-02-27 DIAGNOSIS — I10 Essential (primary) hypertension: Secondary | ICD-10-CM | POA: Diagnosis not present

## 2022-02-27 DIAGNOSIS — E1169 Type 2 diabetes mellitus with other specified complication: Secondary | ICD-10-CM | POA: Diagnosis not present

## 2022-02-27 DIAGNOSIS — E221 Hyperprolactinemia: Secondary | ICD-10-CM | POA: Diagnosis not present

## 2022-02-27 DIAGNOSIS — R69 Illness, unspecified: Secondary | ICD-10-CM | POA: Diagnosis not present

## 2022-02-27 DIAGNOSIS — E782 Mixed hyperlipidemia: Secondary | ICD-10-CM | POA: Diagnosis not present

## 2022-02-27 DIAGNOSIS — D443 Neoplasm of uncertain behavior of pituitary gland: Secondary | ICD-10-CM | POA: Diagnosis not present

## 2022-02-27 DIAGNOSIS — E039 Hypothyroidism, unspecified: Secondary | ICD-10-CM | POA: Diagnosis not present

## 2022-02-27 DIAGNOSIS — N182 Chronic kidney disease, stage 2 (mild): Secondary | ICD-10-CM | POA: Diagnosis not present

## 2022-03-22 DIAGNOSIS — D332 Benign neoplasm of brain, unspecified: Secondary | ICD-10-CM | POA: Diagnosis not present

## 2022-04-02 DIAGNOSIS — M5442 Lumbago with sciatica, left side: Secondary | ICD-10-CM | POA: Diagnosis not present

## 2022-04-02 DIAGNOSIS — M25512 Pain in left shoulder: Secondary | ICD-10-CM | POA: Diagnosis not present

## 2022-04-03 ENCOUNTER — Other Ambulatory Visit: Payer: Self-pay | Admitting: Physical Medicine & Rehabilitation

## 2022-04-03 DIAGNOSIS — G8929 Other chronic pain: Secondary | ICD-10-CM

## 2022-04-04 DIAGNOSIS — H2511 Age-related nuclear cataract, right eye: Secondary | ICD-10-CM | POA: Diagnosis not present

## 2022-04-04 DIAGNOSIS — H2512 Age-related nuclear cataract, left eye: Secondary | ICD-10-CM | POA: Diagnosis not present

## 2022-04-04 DIAGNOSIS — D332 Benign neoplasm of brain, unspecified: Secondary | ICD-10-CM | POA: Diagnosis not present

## 2022-04-04 DIAGNOSIS — E119 Type 2 diabetes mellitus without complications: Secondary | ICD-10-CM | POA: Diagnosis not present

## 2022-04-09 ENCOUNTER — Ambulatory Visit
Admission: RE | Admit: 2022-04-09 | Discharge: 2022-04-09 | Disposition: A | Discharge status: Home / Self Care | Payer: Medicare HMO | Source: Ambulatory Visit | Attending: Physical Medicine & Rehabilitation | Admitting: Physical Medicine & Rehabilitation

## 2022-04-09 DIAGNOSIS — G8929 Other chronic pain: Secondary | ICD-10-CM

## 2022-04-09 DIAGNOSIS — M545 Low back pain, unspecified: Secondary | ICD-10-CM | POA: Diagnosis not present

## 2022-04-14 ENCOUNTER — Other Ambulatory Visit: Payer: Medicare HMO

## 2022-04-16 DIAGNOSIS — M25512 Pain in left shoulder: Secondary | ICD-10-CM | POA: Diagnosis not present

## 2022-04-16 DIAGNOSIS — M5442 Lumbago with sciatica, left side: Secondary | ICD-10-CM | POA: Diagnosis not present

## 2022-04-16 DIAGNOSIS — G8929 Other chronic pain: Secondary | ICD-10-CM | POA: Diagnosis not present

## 2022-04-22 DIAGNOSIS — M25512 Pain in left shoulder: Secondary | ICD-10-CM | POA: Diagnosis not present

## 2022-04-24 DIAGNOSIS — M25512 Pain in left shoulder: Secondary | ICD-10-CM | POA: Diagnosis not present

## 2022-04-29 DIAGNOSIS — M25512 Pain in left shoulder: Secondary | ICD-10-CM | POA: Diagnosis not present

## 2022-04-30 DIAGNOSIS — L309 Dermatitis, unspecified: Secondary | ICD-10-CM | POA: Diagnosis not present

## 2022-05-01 DIAGNOSIS — M25512 Pain in left shoulder: Secondary | ICD-10-CM | POA: Diagnosis not present

## 2022-05-06 DIAGNOSIS — M5459 Other low back pain: Secondary | ICD-10-CM | POA: Diagnosis not present

## 2022-05-06 DIAGNOSIS — M25512 Pain in left shoulder: Secondary | ICD-10-CM | POA: Diagnosis not present

## 2022-05-08 DIAGNOSIS — G8929 Other chronic pain: Secondary | ICD-10-CM | POA: Diagnosis not present

## 2022-05-08 DIAGNOSIS — M25512 Pain in left shoulder: Secondary | ICD-10-CM | POA: Diagnosis not present

## 2022-05-08 DIAGNOSIS — M5442 Lumbago with sciatica, left side: Secondary | ICD-10-CM | POA: Diagnosis not present

## 2022-05-08 DIAGNOSIS — M533 Sacrococcygeal disorders, not elsewhere classified: Secondary | ICD-10-CM | POA: Diagnosis not present

## 2022-05-09 DIAGNOSIS — M25512 Pain in left shoulder: Secondary | ICD-10-CM | POA: Diagnosis not present

## 2022-05-09 DIAGNOSIS — M5459 Other low back pain: Secondary | ICD-10-CM | POA: Diagnosis not present

## 2022-05-13 DIAGNOSIS — M5459 Other low back pain: Secondary | ICD-10-CM | POA: Diagnosis not present

## 2022-05-13 DIAGNOSIS — M25512 Pain in left shoulder: Secondary | ICD-10-CM | POA: Diagnosis not present

## 2022-05-15 DIAGNOSIS — M25512 Pain in left shoulder: Secondary | ICD-10-CM | POA: Diagnosis not present

## 2022-05-15 DIAGNOSIS — M5459 Other low back pain: Secondary | ICD-10-CM | POA: Diagnosis not present

## 2022-05-21 DIAGNOSIS — M5459 Other low back pain: Secondary | ICD-10-CM | POA: Diagnosis not present

## 2022-05-21 DIAGNOSIS — M25512 Pain in left shoulder: Secondary | ICD-10-CM | POA: Diagnosis not present

## 2022-05-23 DIAGNOSIS — M25512 Pain in left shoulder: Secondary | ICD-10-CM | POA: Diagnosis not present

## 2022-05-23 DIAGNOSIS — M5459 Other low back pain: Secondary | ICD-10-CM | POA: Diagnosis not present

## 2022-05-27 DIAGNOSIS — M25512 Pain in left shoulder: Secondary | ICD-10-CM | POA: Diagnosis not present

## 2022-05-27 DIAGNOSIS — M5459 Other low back pain: Secondary | ICD-10-CM | POA: Diagnosis not present

## 2022-05-30 ENCOUNTER — Ambulatory Visit: Payer: Medicare HMO | Admitting: Cardiology

## 2022-05-30 ENCOUNTER — Encounter: Payer: Self-pay | Admitting: Cardiology

## 2022-05-30 VITALS — BP 130/80 | HR 80 | Resp 16 | Ht 65.0 in | Wt 200.0 lb

## 2022-05-30 DIAGNOSIS — I10 Essential (primary) hypertension: Secondary | ICD-10-CM

## 2022-05-30 DIAGNOSIS — E119 Type 2 diabetes mellitus without complications: Secondary | ICD-10-CM

## 2022-05-30 DIAGNOSIS — M25512 Pain in left shoulder: Secondary | ICD-10-CM | POA: Diagnosis not present

## 2022-05-30 DIAGNOSIS — M5459 Other low back pain: Secondary | ICD-10-CM | POA: Diagnosis not present

## 2022-05-30 DIAGNOSIS — Z9889 Other specified postprocedural states: Secondary | ICD-10-CM | POA: Diagnosis not present

## 2022-05-30 NOTE — Progress Notes (Signed)
Primary Physician/Referring:  Georgianne Fick, MD  Patient ID: Linda Hebert, female    DOB: May 18, 1970, 52 y.o.   MRN: [id number]  Chief Complaint  Patient presents with   Medical Clearance   New Patient (Initial Visit)   HPI:    Linda Hebert  is a 52 y.o. Caucasian female with remote history of  intraventricular neurocytoma  SP excision in 2010 and 2019 old has radiologically remained stable followed by [facility], tension, hypercholesterolemia, diabetes mellitus made an appointment to see me to discuss antibiotic prophylaxis.  I had seen her almost 20 years ago.  Patient remains asymptomatic except for chronic back pain.  Past Medical History:  Diagnosis Date   Acid reflux    Brain tumor (benign) (HCC)    Diabetes mellitus    Hypertension    Hypothyroidism    IBS (irritable bowel syndrome)    Memory loss    Migraine    Past Surgical History:  Procedure Laterality Date   BRAIN SURGERY     x 2   heart ablation  01/15/2004   LIVER BIOPSY     WISDOM TOOTH EXTRACTION     Family History  Problem Relation Age of Onset   Hypertension Mother    Hypertension Father    Parkinson's disease Maternal Grandmother     Social History   Tobacco Use   Smoking status: Never   Smokeless tobacco: Never  Substance Use Topics   Alcohol use: Yes    Alcohol/week: 0.0 standard drinks of alcohol    Comment: occasional - 1/2 drink per week or less   Marital Status: Divorced  ROS  Review of Systems  Cardiovascular:  Negative for chest pain, dyspnea on exertion and leg swelling.   Objective      05/30/2022    3:58 PM 05/18/2020    9:21 AM 11/23/2016   12:26 AM  Vitals with BMI  Height 5\' 5"  5\' 5"    Weight 200 lbs 215 lbs   BMI 33.28 35.78   Systolic 130 118 409  Diastolic 80 87 74  Pulse 80 41 74   SpO2: 97 %  Physical Exam Constitutional:      Appearance: She is obese.  Neck:     Vascular: No carotid bruit or JVD.  Cardiovascular:      Rate and Rhythm: Normal rate and regular rhythm.     Pulses: Intact distal pulses.     Heart sounds: Normal heart sounds. No murmur heard.    No gallop.  Pulmonary:     Effort: Pulmonary effort is normal.     Breath sounds: Normal breath sounds.  Abdominal:     General: Bowel sounds are normal.     Palpations: Abdomen is soft.  Musculoskeletal:     Right lower leg: No edema.     Left lower leg: No edema.     Laboratory examination:   External labs:   Labs 02/25/2022:  A1c 5.9%.  TSH normal at 2.31. Urinary albumin to creatinine ratio 11.  Total cholesterol 169, triglycerides 159, HDL 50, LDL 87.  Non-HDL cholesterol 119.  Sodium 144, potassium 4.1, BUN 10, creatinine 0.97, EGFR 68 mL, LFTs normal.  Hb 13.4/HCT 40.0, platelets 328.  Radiology:    Cardiac Studies:    EKG:   EKG 05/29/2021: Normal sinus rhythm at the rate of 74 bpm, incomplete right bundle branch block.  Normal EKG.   Medications and allergies   Allergies  Allergen Reactions  Morphine And Codeine Nausea Only    GI Upset    Propoxyphene Nausea And Vomiting and Nausea Only   Bactrim Nausea And Vomiting   Sulfamethoxazole-Trimethoprim Nausea And Vomiting and Other (See Comments)        Amoxicillin-Pot Clavulanate     Other reaction(s): diarrhea   Ciprofloxacin Nausea And Vomiting   Other Other (See Comments)    All narcotics GI upset   Codeine Nausea Only     Medication list   Current Outpatient Medications:    amLODipine (NORVASC) 2.5 MG tablet, Take 2.5 mg by mouth daily., Disp: , Rfl:    calcium carbonate (OSCAL) 1500 (600 Ca) MG TABS tablet, Take 600 mg of elemental calcium daily with breakfast by mouth., Disp: , Rfl:    Cetirizine HCl 10 MG CAPS, Take by mouth., Disp: , Rfl:    Cholecalciferol (VITAMIN D-1000 MAX ST) 25 MCG (1000 UT) tablet, Take 1 tablet by mouth daily., Disp: , Rfl:    donepezil (ARICEPT) 10 MG tablet, Take 10 mg by mouth at bedtime., Disp: , Rfl:    gabapentin  (NEURONTIN) 100 MG capsule, Take 100 mg by mouth at bedtime., Disp: , Rfl:    levothyroxine (SYNTHROID, LEVOTHROID) 25 MCG tablet, Take 25 mcg by mouth daily., Disp: , Rfl:    LORazepam (ATIVAN) 0.5 MG tablet, Take 0.5 mg by mouth every 6 (six) hours as needed., Disp: , Rfl:    MAGNESIUM-ZINC PO, Take 250 mg by mouth once., Disp: , Rfl:    metFORMIN (GLUCOPHAGE) 500 MG tablet, Take 500 mg by mouth daily with breakfast., Disp: , Rfl:    metoprolol succinate (TOPROL-XL) 100 MG 24 hr tablet, Take 100 mg by mouth daily., Disp: , Rfl:    montelukast (SINGULAIR) 10 MG tablet, Take 10 mg at bedtime by mouth., Disp: , Rfl: 3   Multiple Vitamins-Minerals (ADEKS) chewable tablet, Chew 1 tablet by mouth daily. , Disp: , Rfl:    naproxen (NAPROSYN) 500 MG tablet, Take 1 tablet by mouth 2 (two) times daily as needed., Disp: , Rfl:    nitrofurantoin, macrocrystal-monohydrate, (MACROBID) 100 MG capsule, Take 100 mg by mouth., Disp: , Rfl:    omeprazole (PRILOSEC) 20 MG capsule, Take 20 mg by mouth daily before breakfast., Disp: , Rfl:    OZEMPIC, 1 MG/DOSE, 4 MG/3ML SOPN, Inject 1 mg into the skin once a week., Disp: , Rfl:    Probiotic Product (PROBIOTIC-10 PO), Take by mouth., Disp: , Rfl:    rosuvastatin (CRESTOR) 10 MG tablet, Take 10 mg by mouth daily., Disp: , Rfl:    SUMAtriptan (IMITREX) 100 MG tablet, Take 100 mg by mouth every 2 (two) hours as needed., Disp: , Rfl:    tiZANidine (ZANAFLEX) 2 MG tablet, , Disp: , Rfl:    valACYclovir (VALTREX) 500 MG tablet, Take 500 mg by mouth daily., Disp: , Rfl:    vitamin C (ASCORBIC ACID) 500 MG tablet, Take 500 mg by mouth daily., Disp: , Rfl:    vitamin E 400 UNIT capsule, Take 400 Units by mouth daily., Disp: , Rfl:    Zoledronic Acid (RECLAST IV), Inject into the vein. Once yearly, Disp: , Rfl:    zonisamide (ZONEGRAN) 100 MG capsule, Take 100 mg by mouth 2 (two) times daily., Disp: , Rfl:    fluticasone (FLONASE) 50 MCG/ACT nasal spray, Place 2 sprays daily  into both nostrils. (Patient not taking: Reported on 05/30/2022), Disp: , Rfl: 6   meloxicam (MOBIC) 15 MG tablet, Take 1  tablet by mouth daily. (Patient not taking: Reported on 05/30/2022), Disp: , Rfl:   Assessment     ICD-10-CM   1. Essential hypertension  I10 EKG 12-Lead    2. 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 Type 2 diabetes mellitus without complication, without long-term current use of insulin (HCC)  E11.9        Orders Placed This Encounter  Procedures   EKG 12-Lead    No orders of the defined types were placed in this encounter.   Medications Discontinued During This Encounter  Medication Reason   calcium carbonate (TUMS - DOSED IN MG ELEMENTAL CALCIUM) 500 MG chewable tablet    famotidine (PEPCID AC) 10 MG chewable tablet    meclizine (ANTIVERT) 25 MG tablet    mometasone (NASONEX) 50 MCG/ACT nasal spray    SUMAtriptan (IMITREX) 20 MG/ACT nasal spray      Recommendations:   TARIAH LAMBERTY is a 52 y.o. Caucasian female with remote history of  intraventricular neurocytoma  SP excision in 2010 and 2019 old has radiologically remained stable followed by [facility], tension, hypercholesterolemia, diabetes mellitus made an appointment to see me to discuss antibiotic prophylaxis.  I had seen her almost 20 years ago.  1. Essential hypertension Although patient came in for discussion regarding antibiotic prophylaxis and prior history of RF ablation, she is essentially asymptomatic with excellent control of blood pressure, she is also trying to lose weight and has made significant strides with that as well.  Diabetes is also well-controlled.  LDL goal is <70 but she has been on active Pap for weight loss hence suspect this will improve as well.  I did not make any changes to her medications.  However in view of diabetes mellitus, would recommend consideration for either an ACE inhibitor or an  ARB.  - EKG 12-Lead  2. 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  She remains asymptomatic.  She is also on a beta-blocker therapy.  No changes were done by me.  No need for any further cardiac workup and the patient was asymptomatic otherwise.  3. Type 2 diabetes mellitus without complication, without long-term current use of insulin (HCC) Patient has been trying to lose weight, she is presently on Ozempic, I discussed with her regarding the mechanism of action of Ozempic and also advised her that it is also cardio protective.  Slow eating, taking breaks halfway between the meal also discussed with the patient.  Overall stable from cardiac standpoint, there is no need for endocarditis prophylaxis, her physical examination and EKG is normal except for obesity.  I will see her back on a as needed basis.  Patient felt reassured.   Linda Decamp, MD, [facility] 05/30/2022, 4:32 PM Office: [phone]

## 2022-06-03 DIAGNOSIS — M25512 Pain in left shoulder: Secondary | ICD-10-CM | POA: Diagnosis not present

## 2022-06-03 DIAGNOSIS — M5459 Other low back pain: Secondary | ICD-10-CM | POA: Diagnosis not present

## 2022-06-05 DIAGNOSIS — M25512 Pain in left shoulder: Secondary | ICD-10-CM | POA: Diagnosis not present

## 2022-06-05 DIAGNOSIS — M5459 Other low back pain: Secondary | ICD-10-CM | POA: Diagnosis not present

## 2022-06-07 DIAGNOSIS — F5101 Primary insomnia: Secondary | ICD-10-CM | POA: Diagnosis not present

## 2022-06-07 DIAGNOSIS — E782 Mixed hyperlipidemia: Secondary | ICD-10-CM | POA: Diagnosis not present

## 2022-06-07 DIAGNOSIS — I10 Essential (primary) hypertension: Secondary | ICD-10-CM | POA: Diagnosis not present

## 2022-06-07 DIAGNOSIS — N182 Chronic kidney disease, stage 2 (mild): Secondary | ICD-10-CM | POA: Diagnosis not present

## 2022-06-07 DIAGNOSIS — E1169 Type 2 diabetes mellitus with other specified complication: Secondary | ICD-10-CM | POA: Diagnosis not present

## 2022-06-07 DIAGNOSIS — E039 Hypothyroidism, unspecified: Secondary | ICD-10-CM | POA: Diagnosis not present

## 2022-06-11 DIAGNOSIS — M25512 Pain in left shoulder: Secondary | ICD-10-CM | POA: Diagnosis not present

## 2022-06-11 DIAGNOSIS — M5459 Other low back pain: Secondary | ICD-10-CM | POA: Diagnosis not present

## 2022-06-13 DIAGNOSIS — M5459 Other low back pain: Secondary | ICD-10-CM | POA: Diagnosis not present

## 2022-06-13 DIAGNOSIS — M25512 Pain in left shoulder: Secondary | ICD-10-CM | POA: Diagnosis not present

## 2022-06-14 DIAGNOSIS — E039 Hypothyroidism, unspecified: Secondary | ICD-10-CM | POA: Diagnosis not present

## 2022-06-14 DIAGNOSIS — N182 Chronic kidney disease, stage 2 (mild): Secondary | ICD-10-CM | POA: Diagnosis not present

## 2022-06-14 DIAGNOSIS — D443 Neoplasm of uncertain behavior of pituitary gland: Secondary | ICD-10-CM | POA: Diagnosis not present

## 2022-06-14 DIAGNOSIS — Q2111 Secundum atrial septal defect: Secondary | ICD-10-CM | POA: Diagnosis not present

## 2022-06-14 DIAGNOSIS — I10 Essential (primary) hypertension: Secondary | ICD-10-CM | POA: Diagnosis not present

## 2022-06-14 DIAGNOSIS — E221 Hyperprolactinemia: Secondary | ICD-10-CM | POA: Diagnosis not present

## 2022-06-14 DIAGNOSIS — F5101 Primary insomnia: Secondary | ICD-10-CM | POA: Diagnosis not present

## 2022-06-14 DIAGNOSIS — E782 Mixed hyperlipidemia: Secondary | ICD-10-CM | POA: Diagnosis not present

## 2022-06-14 DIAGNOSIS — E1169 Type 2 diabetes mellitus with other specified complication: Secondary | ICD-10-CM | POA: Diagnosis not present

## 2022-06-17 DIAGNOSIS — M5459 Other low back pain: Secondary | ICD-10-CM | POA: Diagnosis not present

## 2022-06-17 DIAGNOSIS — M25512 Pain in left shoulder: Secondary | ICD-10-CM | POA: Diagnosis not present

## 2022-06-19 DIAGNOSIS — M25512 Pain in left shoulder: Secondary | ICD-10-CM | POA: Diagnosis not present

## 2022-06-19 DIAGNOSIS — M5459 Other low back pain: Secondary | ICD-10-CM | POA: Diagnosis not present

## 2022-06-26 DIAGNOSIS — M25512 Pain in left shoulder: Secondary | ICD-10-CM | POA: Diagnosis not present

## 2022-06-26 DIAGNOSIS — M5459 Other low back pain: Secondary | ICD-10-CM | POA: Diagnosis not present

## 2022-07-01 DIAGNOSIS — M5459 Other low back pain: Secondary | ICD-10-CM | POA: Diagnosis not present

## 2022-07-01 DIAGNOSIS — M25512 Pain in left shoulder: Secondary | ICD-10-CM | POA: Diagnosis not present

## 2022-07-03 DIAGNOSIS — M25512 Pain in left shoulder: Secondary | ICD-10-CM | POA: Diagnosis not present

## 2022-07-03 DIAGNOSIS — M5459 Other low back pain: Secondary | ICD-10-CM | POA: Diagnosis not present

## 2022-07-08 DIAGNOSIS — M25512 Pain in left shoulder: Secondary | ICD-10-CM | POA: Diagnosis not present

## 2022-07-08 DIAGNOSIS — M5459 Other low back pain: Secondary | ICD-10-CM | POA: Diagnosis not present

## 2022-07-10 DIAGNOSIS — M5459 Other low back pain: Secondary | ICD-10-CM | POA: Diagnosis not present

## 2022-07-10 DIAGNOSIS — M25512 Pain in left shoulder: Secondary | ICD-10-CM | POA: Diagnosis not present

## 2022-07-17 DIAGNOSIS — M5459 Other low back pain: Secondary | ICD-10-CM | POA: Diagnosis not present

## 2022-07-17 DIAGNOSIS — M25512 Pain in left shoulder: Secondary | ICD-10-CM | POA: Diagnosis not present

## 2022-07-22 DIAGNOSIS — M5459 Other low back pain: Secondary | ICD-10-CM | POA: Diagnosis not present

## 2022-07-22 DIAGNOSIS — M25512 Pain in left shoulder: Secondary | ICD-10-CM | POA: Diagnosis not present

## 2022-07-23 DIAGNOSIS — M5442 Lumbago with sciatica, left side: Secondary | ICD-10-CM | POA: Diagnosis not present

## 2022-07-23 DIAGNOSIS — M25512 Pain in left shoulder: Secondary | ICD-10-CM | POA: Diagnosis not present

## 2022-07-23 DIAGNOSIS — G8929 Other chronic pain: Secondary | ICD-10-CM | POA: Diagnosis not present

## 2022-07-23 DIAGNOSIS — M533 Sacrococcygeal disorders, not elsewhere classified: Secondary | ICD-10-CM | POA: Diagnosis not present

## 2022-07-23 DIAGNOSIS — M5416 Radiculopathy, lumbar region: Secondary | ICD-10-CM | POA: Diagnosis not present

## 2022-07-24 DIAGNOSIS — M5459 Other low back pain: Secondary | ICD-10-CM | POA: Diagnosis not present

## 2022-07-24 DIAGNOSIS — M25512 Pain in left shoulder: Secondary | ICD-10-CM | POA: Diagnosis not present

## 2022-08-01 DIAGNOSIS — H532 Diplopia: Secondary | ICD-10-CM | POA: Diagnosis not present

## 2022-08-01 DIAGNOSIS — E119 Type 2 diabetes mellitus without complications: Secondary | ICD-10-CM | POA: Diagnosis not present

## 2022-08-01 DIAGNOSIS — D332 Benign neoplasm of brain, unspecified: Secondary | ICD-10-CM | POA: Diagnosis not present

## 2022-08-13 DIAGNOSIS — G8929 Other chronic pain: Secondary | ICD-10-CM | POA: Diagnosis not present

## 2022-08-13 DIAGNOSIS — M5442 Lumbago with sciatica, left side: Secondary | ICD-10-CM | POA: Diagnosis not present

## 2022-08-13 DIAGNOSIS — M25512 Pain in left shoulder: Secondary | ICD-10-CM | POA: Diagnosis not present

## 2022-08-13 DIAGNOSIS — M533 Sacrococcygeal disorders, not elsewhere classified: Secondary | ICD-10-CM | POA: Diagnosis not present

## 2022-08-27 DIAGNOSIS — E119 Type 2 diabetes mellitus without complications: Secondary | ICD-10-CM | POA: Diagnosis not present

## 2022-08-27 DIAGNOSIS — M533 Sacrococcygeal disorders, not elsewhere classified: Secondary | ICD-10-CM | POA: Diagnosis not present

## 2022-09-17 DIAGNOSIS — M533 Sacrococcygeal disorders, not elsewhere classified: Secondary | ICD-10-CM | POA: Diagnosis not present

## 2022-09-17 DIAGNOSIS — G8929 Other chronic pain: Secondary | ICD-10-CM | POA: Diagnosis not present

## 2022-09-17 DIAGNOSIS — M5442 Lumbago with sciatica, left side: Secondary | ICD-10-CM | POA: Diagnosis not present

## 2022-09-17 DIAGNOSIS — M5416 Radiculopathy, lumbar region: Secondary | ICD-10-CM | POA: Diagnosis not present

## 2022-10-21 DIAGNOSIS — M5416 Radiculopathy, lumbar region: Secondary | ICD-10-CM | POA: Diagnosis not present

## 2022-10-21 DIAGNOSIS — E119 Type 2 diabetes mellitus without complications: Secondary | ICD-10-CM | POA: Diagnosis not present

## 2022-11-05 DIAGNOSIS — M533 Sacrococcygeal disorders, not elsewhere classified: Secondary | ICD-10-CM | POA: Diagnosis not present

## 2022-11-05 DIAGNOSIS — G8929 Other chronic pain: Secondary | ICD-10-CM | POA: Diagnosis not present

## 2022-11-05 DIAGNOSIS — M25512 Pain in left shoulder: Secondary | ICD-10-CM | POA: Diagnosis not present

## 2022-11-05 DIAGNOSIS — M5442 Lumbago with sciatica, left side: Secondary | ICD-10-CM | POA: Diagnosis not present

## 2022-11-05 DIAGNOSIS — M5416 Radiculopathy, lumbar region: Secondary | ICD-10-CM | POA: Diagnosis not present

## 2022-11-08 DIAGNOSIS — M5416 Radiculopathy, lumbar region: Secondary | ICD-10-CM | POA: Diagnosis not present

## 2022-11-08 DIAGNOSIS — E119 Type 2 diabetes mellitus without complications: Secondary | ICD-10-CM | POA: Diagnosis not present

## 2022-11-22 DIAGNOSIS — D443 Neoplasm of uncertain behavior of pituitary gland: Secondary | ICD-10-CM | POA: Diagnosis not present

## 2022-11-22 DIAGNOSIS — I10 Essential (primary) hypertension: Secondary | ICD-10-CM | POA: Diagnosis not present

## 2022-11-22 DIAGNOSIS — E221 Hyperprolactinemia: Secondary | ICD-10-CM | POA: Diagnosis not present

## 2022-11-22 DIAGNOSIS — E1169 Type 2 diabetes mellitus with other specified complication: Secondary | ICD-10-CM | POA: Diagnosis not present

## 2022-11-22 DIAGNOSIS — M8580 Other specified disorders of bone density and structure, unspecified site: Secondary | ICD-10-CM | POA: Diagnosis not present

## 2022-11-22 DIAGNOSIS — E039 Hypothyroidism, unspecified: Secondary | ICD-10-CM | POA: Diagnosis not present

## 2022-11-22 DIAGNOSIS — E782 Mixed hyperlipidemia: Secondary | ICD-10-CM | POA: Diagnosis not present

## 2022-11-25 DIAGNOSIS — N951 Menopausal and female climacteric states: Secondary | ICD-10-CM | POA: Diagnosis not present

## 2022-11-26 ENCOUNTER — Other Ambulatory Visit: Payer: Self-pay | Admitting: Obstetrics and Gynecology

## 2022-11-26 DIAGNOSIS — M25562 Pain in left knee: Secondary | ICD-10-CM | POA: Diagnosis not present

## 2022-11-26 DIAGNOSIS — M5416 Radiculopathy, lumbar region: Secondary | ICD-10-CM | POA: Diagnosis not present

## 2022-11-26 DIAGNOSIS — Z1231 Encounter for screening mammogram for malignant neoplasm of breast: Secondary | ICD-10-CM

## 2022-11-26 DIAGNOSIS — Z0000 Encounter for general adult medical examination without abnormal findings: Secondary | ICD-10-CM

## 2022-11-26 DIAGNOSIS — G8929 Other chronic pain: Secondary | ICD-10-CM | POA: Diagnosis not present

## 2022-11-26 DIAGNOSIS — M5442 Lumbago with sciatica, left side: Secondary | ICD-10-CM | POA: Diagnosis not present

## 2022-11-27 ENCOUNTER — Ambulatory Visit
Admission: RE | Admit: 2022-11-27 | Discharge: 2022-11-27 | Disposition: A | Discharge status: Home / Self Care | Payer: Medicare HMO | Source: Ambulatory Visit | Attending: Obstetrics and Gynecology | Admitting: Obstetrics and Gynecology

## 2022-11-27 DIAGNOSIS — Z0000 Encounter for general adult medical examination without abnormal findings: Secondary | ICD-10-CM

## 2022-11-27 DIAGNOSIS — Z1231 Encounter for screening mammogram for malignant neoplasm of breast: Secondary | ICD-10-CM

## 2022-11-29 DIAGNOSIS — N182 Chronic kidney disease, stage 2 (mild): Secondary | ICD-10-CM | POA: Diagnosis not present

## 2022-11-29 DIAGNOSIS — I10 Essential (primary) hypertension: Secondary | ICD-10-CM | POA: Diagnosis not present

## 2022-11-29 DIAGNOSIS — E039 Hypothyroidism, unspecified: Secondary | ICD-10-CM | POA: Diagnosis not present

## 2022-11-29 DIAGNOSIS — D443 Neoplasm of uncertain behavior of pituitary gland: Secondary | ICD-10-CM | POA: Diagnosis not present

## 2022-11-29 DIAGNOSIS — F5101 Primary insomnia: Secondary | ICD-10-CM | POA: Diagnosis not present

## 2022-11-29 DIAGNOSIS — E1169 Type 2 diabetes mellitus with other specified complication: Secondary | ICD-10-CM | POA: Diagnosis not present

## 2022-11-29 DIAGNOSIS — E221 Hyperprolactinemia: Secondary | ICD-10-CM | POA: Diagnosis not present

## 2022-11-29 DIAGNOSIS — E782 Mixed hyperlipidemia: Secondary | ICD-10-CM | POA: Diagnosis not present

## 2022-12-17 DIAGNOSIS — M533 Sacrococcygeal disorders, not elsewhere classified: Secondary | ICD-10-CM | POA: Diagnosis not present

## 2022-12-17 DIAGNOSIS — M25562 Pain in left knee: Secondary | ICD-10-CM | POA: Diagnosis not present

## 2022-12-17 DIAGNOSIS — M5442 Lumbago with sciatica, left side: Secondary | ICD-10-CM | POA: Diagnosis not present

## 2022-12-17 DIAGNOSIS — M5416 Radiculopathy, lumbar region: Secondary | ICD-10-CM | POA: Diagnosis not present

## 2022-12-17 DIAGNOSIS — G8929 Other chronic pain: Secondary | ICD-10-CM | POA: Diagnosis not present

## 2022-12-17 DIAGNOSIS — M1712 Unilateral primary osteoarthritis, left knee: Secondary | ICD-10-CM | POA: Diagnosis not present

## 2023-01-11 ENCOUNTER — Emergency Department
Admission: EM | Admit: 2023-01-11 | Discharge: 2023-01-11 | Disposition: A | Discharge status: Home / Self Care | Payer: Medicare HMO | Attending: Emergency Medicine | Admitting: Emergency Medicine

## 2023-01-11 ENCOUNTER — Emergency Department: Payer: Medicare HMO

## 2023-01-11 ENCOUNTER — Other Ambulatory Visit: Payer: Self-pay

## 2023-01-11 DIAGNOSIS — R2689 Other abnormalities of gait and mobility: Secondary | ICD-10-CM | POA: Diagnosis not present

## 2023-01-11 DIAGNOSIS — H539 Unspecified visual disturbance: Secondary | ICD-10-CM | POA: Insufficient documentation

## 2023-01-11 DIAGNOSIS — I10 Essential (primary) hypertension: Secondary | ICD-10-CM | POA: Diagnosis not present

## 2023-01-11 DIAGNOSIS — I639 Cerebral infarction, unspecified: Secondary | ICD-10-CM | POA: Diagnosis not present

## 2023-01-11 DIAGNOSIS — H532 Diplopia: Secondary | ICD-10-CM | POA: Diagnosis not present

## 2023-01-11 DIAGNOSIS — R42 Dizziness and giddiness: Secondary | ICD-10-CM | POA: Diagnosis not present

## 2023-01-11 DIAGNOSIS — D496 Neoplasm of unspecified behavior of brain: Secondary | ICD-10-CM | POA: Diagnosis not present

## 2023-01-11 DIAGNOSIS — Z982 Presence of cerebrospinal fluid drainage device: Secondary | ICD-10-CM | POA: Diagnosis not present

## 2023-01-11 LAB — DIFFERENTIAL
Abs Immature Granulocytes: 0.02 10*3/uL (ref 0.00–0.07)
Basophils Absolute: 0.1 10*3/uL (ref 0.0–0.1)
Basophils Relative: 1 %
Eosinophils Absolute: 0.2 10*3/uL (ref 0.0–0.5)
Eosinophils Relative: 3 %
Immature Granulocytes: 0 %
Lymphocytes Relative: 36 %
Lymphs Abs: 2.5 10*3/uL (ref 0.7–4.0)
Monocytes Absolute: 0.6 10*3/uL (ref 0.1–1.0)
Monocytes Relative: 9 %
Neutro Abs: 3.6 10*3/uL (ref 1.7–7.7)
Neutrophils Relative %: 51 %

## 2023-01-11 LAB — CBC
HCT: 39.9 % (ref 36.0–46.0)
Hemoglobin: 13.2 g/dL (ref 12.0–15.0)
MCH: 29.7 pg (ref 26.0–34.0)
MCHC: 33.1 g/dL (ref 30.0–36.0)
MCV: 89.9 fL (ref 80.0–100.0)
Platelets: 284 10*3/uL (ref 150–400)
RBC: 4.44 MIL/uL (ref 3.87–5.11)
RDW: 13.7 % (ref 11.5–15.5)
WBC: 7 10*3/uL (ref 4.0–10.5)
nRBC: 0 % (ref 0.0–0.2)

## 2023-01-11 LAB — COMPREHENSIVE METABOLIC PANEL
ALT: 19 U/L (ref 0–44)
AST: 22 U/L (ref 15–41)
Albumin: 4.2 g/dL (ref 3.5–5.0)
Alkaline Phosphatase: 85 U/L (ref 38–126)
Anion gap: 8 (ref 5–15)
BUN: 14 mg/dL (ref 6–20)
CO2: 22 mmol/L (ref 22–32)
Calcium: 9 mg/dL (ref 8.9–10.3)
Chloride: 111 mmol/L (ref 98–111)
Creatinine, Ser: 0.79 mg/dL (ref 0.44–1.00)
GFR, Estimated: >60 mL/min (ref >60)
Glucose, Bld: 83 mg/dL (ref 70–99)
Potassium: 4 mmol/L (ref 3.5–5.1)
Sodium: 141 mmol/L (ref 135–145)
Total Bilirubin: 0.4 mg/dL (ref <1.2)
Total Protein: 7.5 g/dL (ref 6.5–8.1)

## 2023-01-11 NOTE — ED Notes (Signed)
First nurse note: Sent to ED for further imaging from [facility]. Has been having unsteady gait, blurry vision, double vision since 2 days. [facility] nurse unsure if any unilateral weakness but pt is ambulatory at this time.  KC VS: 143/90, 75 HR, 98%, 97.4.

## 2023-01-11 NOTE — ED Provider Notes (Signed)
[facility] Provider Note    Event Date/Time   First MD Initiated Contact with Patient 01/11/23 1626     (approximate)   History   Stroke-like symptoms   HPI  Linda Hebert is a 52 year old female presenting to the emergency department for evaluation of brief episodes of visual changes over the past few days.  Patient reports about 3-5 episodes lasting for about 5 minutes where she has visual changes.  She has had a couple episodes of double vision, but reports that when she covers either eye this would resolve her double vision. She had one episode of steaminess in her visual field.  Additionally reports 1 episode where she felt off balance in a store that resolved after few minutes.  No fall or head trauma.  Has not had any headache or dizziness during this time.  No numbness, tingling, focal weakness.  Has a history of migraines, but reports these are currently well-controlled with no recent headache.  No history of similar.  Did recently have some of her pain medication adjusted.      Physical Exam   Triage Vital Signs: ED Triage Vitals  Encounter Vitals Group     BP 01/11/23 1050 (!) 144/92     Systolic BP Percentile --      Diastolic BP Percentile --      Pulse Rate 01/11/23 1050 75     Resp 01/11/23 1050 17     Temp 01/11/23 1050 97.8 F (36.6 C)     Temp Source 01/11/23 1050 Oral     SpO2 01/11/23 1050 95 %     Weight 01/11/23 1051 210 lb (95.3 kg)     Height 01/11/23 1051 5\' 5"  (1.651 m)     Head Circumference --      Peak Flow --      Pain Score 01/11/23 1050 1     Pain Loc --      Pain Education --      Exclude from Growth Chart --     Most recent vital signs: Vitals:   01/11/23 1630 01/11/23 1700  BP: (!) 130/93 (!) 148/97  Pulse: 68 81  Resp: 13 16  Temp:    SpO2: 100% 98%     General: Awake, interactive  Eye:  Pupils equal and reactive, visual acuity 20/30 in left eye, 20/15 in right eye with corrective lenses,  visual fields intact, no conjunctival injection CV:  Regular rate, good peripheral perfusion.  Resp:  Unlabored respirations, lungs clear to auscultation Abd:  Nondistended, soft, nontender Neuro:  Keenly aware, correctly answers month and age, able to blink eyes and squeeze hands, normal extraocular movements, no visual field loss, normal facial symmetry, no arm or leg motor drift, no limb ataxia, normal sensation, no aphasia, no dysarthria, no inattention    ED Results / Procedures / Treatments   Labs (all labs ordered are listed, but only abnormal results are displayed) Labs Reviewed  CBC  DIFFERENTIAL  COMPREHENSIVE METABOLIC PANEL     EKG EKG independently reviewed interpreted by myself (ER attending) demonstrates:  EKG demonstrates normal sinus rhythm rate of 76, PR 128, QRS 106, QTc 450, no acute ST changes  RADIOLOGY Imaging independently reviewed and interpreted by myself demonstrates:  CT head without acute bleed, stable previously noted tumor  PROCEDURES:  Critical Care performed: No  Procedures   MEDICATIONS ORDERED IN ED: Medications - No data to display   IMPRESSION / MDM / ASSESSMENT AND PLAN /  ED COURSE  I reviewed the triage vital signs and the nursing notes.  Differential diagnosis includes, but is not limited to, primary ocular pathology including inappropriate corrective lenses, misalignment of eyes, medication adverse effect, complex migraine, consideration for TIA though clinical history quite atypical for this and description of binocular diplopia more suggestive of primary ocular pathology  Patient's presentation is most consistent with acute presentation with potential threat to life or bodily function.  52 year old female presenting to the emergency department for evaluation of episodes of vision and balance difficulty.  Clinical history is not consistent with lightheadedness or vertigo.  Reassuring neurologic exam here.  CT head reassuring.   Quite atypical history for TIA, but I did discuss the possibility of MRI versus outpatient follow-up.  Would have low risk ABCD 2 score if considered a TIA.  After discussion of risk and benefits, patient would prefer to follow-up as an outpatient with her primary care doctor for further evaluation.  She will also arrange follow-up with her ophthalmologist for further ocular evaluation as I suspect that this may be contributing to some of her visual changes.  Strict return precautions provided.  Patient discharged stable condition.     FINAL CLINICAL IMPRESSION(S) / ED DIAGNOSES   Final diagnoses:  Visual changes  Balance problem     Rx / DC Orders   ED Discharge Orders     None        Note:  This document was prepared using Dragon voice recognition software and may include unintentional dictation errors.   Trinna Post, MD 01/11/23 [phone]

## 2023-01-11 NOTE — ED Triage Notes (Signed)
Pt sent from [facility] for stroke-like symptoms x 2 days. Pt states on 12/26 she had vertigo - she lost her balance about 3 times in a store while shopping. States she felt like there was a weight pulling her backwards. On 12/27 she had 2 bouts of double vision and today she saw "heat waves" out of left eye. Now complaining of pain in left temple area and pressure/stabbing pain in right eye.

## 2023-01-11 NOTE — Discharge Instructions (Signed)
You were seen in the ER today for your episodes of changes in your vision and balance.  Your testing here was reassuring including a negative head CT.  We discussed potentially obtaining an MRI of your brain, but after shared decision making, we did plan for you to follow-up with your primary care doctor where you can discuss further evaluation of your symptoms including any additional testing such as an MRI.  Please also arrange close follow-up with your ophthalmologist for further evaluation of your vision changes.  Return to the ER for any new or worsening symptoms.

## 2023-01-13 DIAGNOSIS — H43813 Vitreous degeneration, bilateral: Secondary | ICD-10-CM | POA: Diagnosis not present

## 2023-01-13 DIAGNOSIS — D332 Benign neoplasm of brain, unspecified: Secondary | ICD-10-CM | POA: Diagnosis not present

## 2023-01-13 DIAGNOSIS — H532 Diplopia: Secondary | ICD-10-CM | POA: Diagnosis not present

## 2023-01-13 DIAGNOSIS — H2511 Age-related nuclear cataract, right eye: Secondary | ICD-10-CM | POA: Diagnosis not present

## 2023-01-13 DIAGNOSIS — E119 Type 2 diabetes mellitus without complications: Secondary | ICD-10-CM | POA: Diagnosis not present

## 2023-02-11 ENCOUNTER — Telehealth: Payer: Self-pay | Admitting: Internal Medicine

## 2023-02-11 NOTE — Telephone Encounter (Signed)
Copied from CRM [id number]. Topic: General - Other >> Feb 11, 2023  2:23 PM Denese Killings wrote: Reason for CRM: Patient stated that she has friends that see Dr. Dion Saucier and they would like to see him. Patient wants to ask Dr. Sharen Hones if he can accept her and her parents as new patients.

## 2023-02-14 NOTE — Telephone Encounter (Signed)
I'm closed to new patients at this time however we do have other providers accepting new patients.  Who was the friend? I don't remember if I may have talked with her friend about this.

## 2023-02-14 NOTE — Telephone Encounter (Signed)
 [email or web address]

## 2023-02-18 DIAGNOSIS — M5416 Radiculopathy, lumbar region: Secondary | ICD-10-CM | POA: Diagnosis not present

## 2023-02-18 DIAGNOSIS — M25512 Pain in left shoulder: Secondary | ICD-10-CM | POA: Diagnosis not present

## 2023-02-18 DIAGNOSIS — M5442 Lumbago with sciatica, left side: Secondary | ICD-10-CM | POA: Diagnosis not present

## 2023-02-18 DIAGNOSIS — M533 Sacrococcygeal disorders, not elsewhere classified: Secondary | ICD-10-CM | POA: Diagnosis not present

## 2023-02-27 DIAGNOSIS — M76822 Posterior tibial tendinitis, left leg: Secondary | ICD-10-CM | POA: Diagnosis not present

## 2023-02-27 DIAGNOSIS — M7672 Peroneal tendinitis, left leg: Secondary | ICD-10-CM | POA: Diagnosis not present

## 2023-02-27 DIAGNOSIS — M79672 Pain in left foot: Secondary | ICD-10-CM | POA: Diagnosis not present

## 2023-02-27 DIAGNOSIS — E119 Type 2 diabetes mellitus without complications: Secondary | ICD-10-CM | POA: Diagnosis not present

## 2023-03-04 DIAGNOSIS — G8929 Other chronic pain: Secondary | ICD-10-CM | POA: Diagnosis not present

## 2023-03-04 DIAGNOSIS — E119 Type 2 diabetes mellitus without complications: Secondary | ICD-10-CM | POA: Diagnosis not present

## 2023-03-04 DIAGNOSIS — M25552 Pain in left hip: Secondary | ICD-10-CM | POA: Diagnosis not present

## 2023-03-18 DIAGNOSIS — M25552 Pain in left hip: Secondary | ICD-10-CM | POA: Diagnosis not present

## 2023-03-18 DIAGNOSIS — M5416 Radiculopathy, lumbar region: Secondary | ICD-10-CM | POA: Diagnosis not present

## 2023-03-18 DIAGNOSIS — M5442 Lumbago with sciatica, left side: Secondary | ICD-10-CM | POA: Diagnosis not present

## 2023-03-18 DIAGNOSIS — M533 Sacrococcygeal disorders, not elsewhere classified: Secondary | ICD-10-CM | POA: Diagnosis not present

## 2023-03-19 DIAGNOSIS — H547 Unspecified visual loss: Secondary | ICD-10-CM | POA: Diagnosis not present

## 2023-03-19 DIAGNOSIS — D332 Benign neoplasm of brain, unspecified: Secondary | ICD-10-CM | POA: Diagnosis not present

## 2023-03-25 DIAGNOSIS — E039 Hypothyroidism, unspecified: Secondary | ICD-10-CM | POA: Diagnosis not present

## 2023-03-25 DIAGNOSIS — N182 Chronic kidney disease, stage 2 (mild): Secondary | ICD-10-CM | POA: Diagnosis not present

## 2023-03-25 DIAGNOSIS — E1169 Type 2 diabetes mellitus with other specified complication: Secondary | ICD-10-CM | POA: Diagnosis not present

## 2023-03-25 DIAGNOSIS — C710 Malignant neoplasm of cerebrum, except lobes and ventricles: Secondary | ICD-10-CM | POA: Diagnosis not present

## 2023-03-25 DIAGNOSIS — C715 Malignant neoplasm of cerebral ventricle: Secondary | ICD-10-CM | POA: Diagnosis not present

## 2023-03-25 DIAGNOSIS — I10 Essential (primary) hypertension: Secondary | ICD-10-CM | POA: Diagnosis not present

## 2023-03-25 DIAGNOSIS — E782 Mixed hyperlipidemia: Secondary | ICD-10-CM | POA: Diagnosis not present

## 2023-03-25 LAB — BASIC METABOLIC PANEL WITH GFR
BUN: 10 (ref 4–21)
CO2: 22 (ref 13–22)
Chloride: 109 — AB (ref 99–108)
Creatinine: 1 (ref 0.5–1.1)
Glucose: 109
Potassium: 4.2 meq/L (ref 3.5–5.1)
Sodium: 144 (ref 137–147)

## 2023-03-25 LAB — HEPATIC FUNCTION PANEL
ALT: 16 U/L (ref 7–35)
AST: 17 (ref 13–35)
Alkaline Phosphatase: 92 (ref 25–125)
Bilirubin, Total: 0.2

## 2023-03-25 LAB — CBC AND DIFFERENTIAL
HCT: 41 (ref 36–46)
Hemoglobin: 13.4 (ref 12.0–16.0)
Platelets: 283 10*3/uL (ref 150–400)
WBC: 6.4

## 2023-03-25 LAB — LIPID PANEL
Cholesterol: 113 (ref 0–200)
HDL: 50 (ref 35–70)
LDL Cholesterol: 40
Triglycerides: 130 (ref 40–160)

## 2023-03-25 LAB — COMPREHENSIVE METABOLIC PANEL WITH GFR
Alb/Creat Ratio, Ur: 10 mg/g{creat}
Albumin, Urine POC: 18.1
Albumin: 4.4 (ref 3.5–5.0)
Creatinine, POC: 186.8 mg/dL
EGFR (Non-African Amer.): 69
Globulin: 2.5
HM Hepatitis Screen: NEGATIVE
TSH: 1.51 (ref 0.41–5.90)

## 2023-03-25 LAB — CBC: RBC: 4.59 (ref 3.87–5.11)

## 2023-03-25 LAB — MICROALBUMIN / CREATININE URINE RATIO: Microalb Creat Ratio: 10

## 2023-03-25 LAB — PROTEIN / CREATININE RATIO, URINE
Albumin, U: 18.1
Creatinine, Urine: 186.8

## 2023-03-25 LAB — HEMOGLOBIN A1C: Hemoglobin A1C: 6.7

## 2023-03-25 LAB — TSH: TSH: 1.51 (ref 0.41–5.90)

## 2023-04-01 DIAGNOSIS — Z23 Encounter for immunization: Secondary | ICD-10-CM | POA: Diagnosis not present

## 2023-04-01 DIAGNOSIS — Z0000 Encounter for general adult medical examination without abnormal findings: Secondary | ICD-10-CM | POA: Diagnosis not present

## 2023-04-01 LAB — HEMOGLOBIN A1C: Hemoglobin A1C: 6.7

## 2023-04-01 LAB — HEPATIC FUNCTION PANEL
ALT: 16 U/L (ref 7–35)
AST: 17 (ref 13–35)
Bilirubin, Total: 0.2

## 2023-04-01 LAB — BASIC METABOLIC PANEL WITH GFR
BUN: 10 (ref 4–21)
CO2: 113 — AB (ref 13–22)
Chloride: 109 — AB (ref 99–108)
Potassium: 4.2 meq/L (ref 3.5–5.1)

## 2023-04-01 LAB — COMPREHENSIVE METABOLIC PANEL WITH GFR
Calcium: 9.4 (ref 8.7–10.7)
eGFR: 69

## 2023-04-01 LAB — LIPID PANEL
HDL: 50 (ref 35–70)
Triglycerides: 130 (ref 40–160)

## 2023-04-01 LAB — PROTEIN / CREATININE RATIO, URINE: Albumin, U: 4.4

## 2023-04-08 ENCOUNTER — Ambulatory Visit (INDEPENDENT_AMBULATORY_CARE_PROVIDER_SITE_OTHER): Payer: Medicare Other | Admitting: Nurse Practitioner

## 2023-04-08 ENCOUNTER — Encounter: Payer: Self-pay | Admitting: Nurse Practitioner

## 2023-04-08 VITALS — BP 118/78 | HR 74 | Temp 98.3°F | Ht 65.0 in | Wt 211.6 lb

## 2023-04-08 DIAGNOSIS — R413 Other amnesia: Secondary | ICD-10-CM | POA: Diagnosis not present

## 2023-04-08 DIAGNOSIS — Z9889 Other specified postprocedural states: Secondary | ICD-10-CM

## 2023-04-08 DIAGNOSIS — E1165 Type 2 diabetes mellitus with hyperglycemia: Secondary | ICD-10-CM | POA: Insufficient documentation

## 2023-04-08 DIAGNOSIS — K219 Gastro-esophageal reflux disease without esophagitis: Secondary | ICD-10-CM | POA: Insufficient documentation

## 2023-04-08 DIAGNOSIS — Z9109 Other allergy status, other than to drugs and biological substances: Secondary | ICD-10-CM | POA: Insufficient documentation

## 2023-04-08 DIAGNOSIS — I10 Essential (primary) hypertension: Secondary | ICD-10-CM | POA: Insufficient documentation

## 2023-04-08 DIAGNOSIS — Z7689 Persons encountering health services in other specified circumstances: Secondary | ICD-10-CM | POA: Insufficient documentation

## 2023-04-08 DIAGNOSIS — G43709 Chronic migraine without aura, not intractable, without status migrainosus: Secondary | ICD-10-CM

## 2023-04-08 DIAGNOSIS — G8929 Other chronic pain: Secondary | ICD-10-CM | POA: Insufficient documentation

## 2023-04-08 DIAGNOSIS — Z7985 Long-term (current) use of injectable non-insulin antidiabetic drugs: Secondary | ICD-10-CM

## 2023-04-08 DIAGNOSIS — M5440 Lumbago with sciatica, unspecified side: Secondary | ICD-10-CM | POA: Diagnosis not present

## 2023-04-08 DIAGNOSIS — E669 Obesity, unspecified: Secondary | ICD-10-CM | POA: Insufficient documentation

## 2023-04-08 DIAGNOSIS — E039 Hypothyroidism, unspecified: Secondary | ICD-10-CM | POA: Insufficient documentation

## 2023-04-08 MED ORDER — LEVOCETIRIZINE DIHYDROCHLORIDE 5 MG PO TABS: 5.0000 mg | ORAL_TABLET | Freq: Every evening | ORAL | 1 refills | Status: DC

## 2023-04-08 NOTE — Assessment & Plan Note (Signed)
 History of same on budesonide and Imitrex as needed.  Well-controlled on medication.  Patient is followed by [facility] oncology/neurosurgery.

## 2023-04-08 NOTE — Assessment & Plan Note (Signed)
 Patient currently maintained on omeprazole 20 mg daily.  GERD well-controlled patient does not have to modify her diet.  Continue medication as prescribed

## 2023-04-08 NOTE — Assessment & Plan Note (Addendum)
 History of same.  Will get labs released that were recently drawn from previous per provider

## 2023-04-08 NOTE — Assessment & Plan Note (Signed)
 History of same.  Patient currently maintained on levothyroxine 25 mcg daily

## 2023-04-08 NOTE — Assessment & Plan Note (Signed)
 Unable to review medical records in electronic medical record.  Patient did sign a release to get records

## 2023-04-08 NOTE — Assessment & Plan Note (Signed)
 Patient currently maintained on Ozempic 2 mg weekly.  Does not check glucose at home.

## 2023-04-08 NOTE — Patient Instructions (Signed)
 Nice to see you today I have sent in an allergy pill Xyzal (levocetrizine). We will hold the singular for now Follow up with me in 3 months, sooner if you need me

## 2023-04-08 NOTE — Assessment & Plan Note (Signed)
 Followed by [facility] oncology continue

## 2023-04-08 NOTE — Assessment & Plan Note (Signed)
 Patient currently maintained on amlodipine 2.5 mg daily and metoprolol 100 mg daily.  Blood pressure well-controlled.  Continue medication as prescribed

## 2023-04-08 NOTE — Assessment & Plan Note (Signed)
 Secondary to brain tumors or brain surgery.  Patient is currently maintained on donepezil 10 mg.  Continue

## 2023-04-08 NOTE — Assessment & Plan Note (Signed)
 Patient sees physical medicine through [facility] clinic.  Maintained on gabapentin 100 mg nightly.  Continue hide

## 2023-04-08 NOTE — Progress Notes (Signed)
 New Patient Office Visit  Subjective    Patient ID: Linda Hebert, female    DOB: March 20, 1970  Age: 53 y.o. MRN: [id number]  CC:  Chief Complaint  Patient presents with   Establish Care    Received both shingles vaccine with [facility] medical, Received lab work and physical last week.     HPI Linda Hebert presents to establish care   HTN: currently on amlodipine 2.5 and metoprolol 100mg  daily. Does check bp at home only as needed.    Chornic migraine: imitrex,  zonidsamide. Does not follow with neurology outside of [facility].  Patient states she does not have regular migraines anymore  DM2:ozempic 2 mg weekly. Does not check blood sugar at home.  Patient states A1c was drawn recently and was not at goal, reported increase her Ozempic at that time to 2 mg  Memory loss: donepezil 10mg .  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 States that she has been having watery eyes, sneezing and congestion. States that she does not use an antihistamine  Hypothyroidism: levothyroxine  Gerd:omeprazole 20mg  daily. Does not have to change her eating habits   Back pain: gabapentin 100g a night time.  She is followed by physical medicine through [facility] clinic  Brain Cancer: followed by [facility].   Mammogram: 11/29/2022  Dexa: up to date  Pap smear: Richardean Chimera this past year   Tdap: 2021 Flu:09/2022 Covid: Up-to-date Pna: Up-to-date Shingles: utd   Outpatient Encounter Medications as of 04/08/2023  Medication Sig   amLODipine (NORVASC) 2.5 MG tablet Take 2.5 mg by mouth daily.   calcium carbonate (OSCAL) 1500 (600 Ca) MG TABS tablet Take 600 mg of elemental calcium daily with breakfast by mouth.   celecoxib (CELEBREX) 200 MG capsule Take by mouth.   Cholecalciferol (VITAMIN D-1000 MAX ST) 25 MCG (1000 UT) tablet Take 1 tablet by mouth daily.   donepezil (ARICEPT) 10 MG tablet  Take 10 mg by mouth at bedtime.   fluticasone (FLONASE) 50 MCG/ACT nasal spray Place 2 sprays into both nostrils daily.   gabapentin (NEURONTIN) 100 MG capsule Take 100 mg by mouth at bedtime.   levocetirizine (XYZAL) 5 MG tablet Take 1 tablet (5 mg total) by mouth every evening.   levothyroxine (SYNTHROID, LEVOTHROID) 25 MCG tablet Take 25 mcg by mouth daily.   LORazepam (ATIVAN) 0.5 MG tablet Take 0.5 mg by mouth every 6 (six) hours as needed.   MAGNESIUM-ZINC PO Take 250 mg by mouth once.   metoprolol succinate (TOPROL-XL) 100 MG 24 hr tablet Take 100 mg by mouth daily.   montelukast (SINGULAIR) 10 MG tablet Take 10 mg at bedtime by mouth.   Multiple Vitamins-Minerals (ADEKS) chewable tablet Chew 1 tablet by mouth daily.    naproxen (NAPROSYN) 500 MG tablet Take 1 tablet by mouth 2 (two) times daily as needed.   nitrofurantoin, macrocrystal-monohydrate, (MACROBID) 100 MG capsule Take 100 mg by mouth.   omeprazole (PRILOSEC) 20 MG capsule Take 20 mg by mouth daily before breakfast.   OZEMPIC, 1 MG/DOSE, 4 MG/3ML SOPN Inject 1 mg into the skin once a week.   Probiotic Product (PROBIOTIC-10 PO) Take by mouth.   rosuvastatin (CRESTOR) 10 MG tablet Take 10 mg by mouth daily.   SUMAtriptan (IMITREX) 100 MG tablet Take 100 mg by mouth every 2 (two) hours as needed.   tiZANidine (ZANAFLEX) 2 MG tablet    valACYclovir (  VALTREX) 500 MG tablet Take 500 mg by mouth daily.   vitamin C (ASCORBIC ACID) 500 MG tablet Take 500 mg by mouth daily.   vitamin E 400 UNIT capsule Take 400 Units by mouth daily.   Zoledronic Acid (RECLAST IV) Inject into the vein. Once yearly   zonisamide (ZONEGRAN) 100 MG capsule Take 100 mg by mouth 2 (two) times daily.   [DISCONTINUED] Cetirizine HCl 10 MG CAPS Take by mouth. (Patient not taking: Reported on 04/08/2023)   [DISCONTINUED] meloxicam (MOBIC) 15 MG tablet Take 1 tablet by mouth daily. (Patient not taking: Reported on 05/30/2022)   [DISCONTINUED] metFORMIN  (GLUCOPHAGE) 500 MG tablet Take 500 mg by mouth daily with breakfast.   No facility-administered encounter medications on file as of 04/08/2023.    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   Arthritis 2025   Hip, back, left knee   Brain tumor (benign) (HCC)    Diabetes mellitus    Heart murmur    When I was young   Hypertension    Not sure of date   Hypothyroidism    IBS (irritable bowel syndrome)    Memory loss    Migraine    Osteoporosis 2005   Found with Brain Tumor Surgery    Past Surgical History:  Procedure Laterality Date   BRAIN SURGERY     intraventricular neurocytoma  SP excision in 2010 and 2019 at [facility]  then RT   heart ablation  01/15/2004   LIVER BIOPSY     WISDOM TOOTH EXTRACTION      Family History  Problem Relation Age of Onset   Hypertension Mother    Hypertension Father    Hearing loss Father    Parkinson's disease Maternal Grandmother    Cancer Maternal Grandfather     Social History   Socioeconomic History   Marital status: Divorced    Spouse name: Not on file   Number of children: 0   Years of education: 16   Highest education level: Bachelor's degree (e.g., BA, AB, BS)  Occupational History   Occupation: Out of work on disability  Tobacco Use   Smoking status: Never   Smokeless tobacco: Never  Vaping Use   Vaping status: Never Used  Substance and Sexual Activity   Alcohol use: Yes    Alcohol/week: 2.0 standard drinks of alcohol    Types: 2 Standard drinks or equivalent per week    Comment: occasional - 1/2 drink per week or less   Drug use: No   Sexual activity: Not Currently    Birth control/protection: Other-see comments    Comment: Since Tumor Surgeries I am not able to get pregnant.  Other Topics Concern   Not on file  Social History Narrative   Stays at home   Social Drivers of Health    Financial Resource Strain: Medium Risk (04/01/2023)   Overall Financial Resource Strain (CARDIA)    Difficulty of Paying Living Expenses: Somewhat hard  Food Insecurity: No Food Insecurity (04/01/2023)   Hunger Vital Sign    Worried About Running Out of Food in the Last Year: Never true    Ran Out of Food in the Last Year: Never true  Recent Concern: Food Insecurity - Food Insecurity Present (02/24/2023)   Received from [facility]   Hunger Vital Sign  Worried About Programme researcher, broadcasting/film/video in the Last Year: Sometimes true    [employer] of Food in the Last Year: Never true  Transportation Needs: No Transportation Needs (04/01/2023)   PRAPARE - Administrator, Civil Service (Medical): No    Lack of Transportation (Non-Medical): No  Physical Activity: Unknown (04/01/2023)   Exercise Vital Sign    Days of Exercise per Week: 0 days    Minutes of Exercise per Session: Not on file  Stress: No Stress Concern Present (04/01/2023)   [employer] of Occupational Health - Occupational Stress Questionnaire    Feeling of Stress : Only a little  Social Connections: Socially Isolated (04/01/2023)   Social Connection and Isolation Panel [NHANES]    Frequency of Communication with Friends and Family: More than three times a week    Frequency of Social Gatherings with Friends and Family: More than three times a week    Attends Religious Services: Never    Database administrator or Organizations: No    Attends Engineer, structural: Not on file    Marital Status: Divorced  Intimate Partner Violence: Not on file    Review of Systems  Constitutional:  Negative for chills and fever.  Respiratory:  Negative for shortness of breath.   Cardiovascular:  Negative for chest pain and leg swelling.  Gastrointestinal:  Negative for abdominal pain, blood in stool, constipation, diarrhea, nausea and vomiting.       Bm daily   Genitourinary:  Negative for dysuria and hematuria.   Neurological:  Positive for headaches. Negative for tingling.  Psychiatric/Behavioral:  Negative for hallucinations and suicidal ideas.         Objective    BP 118/78   Pulse 74   Temp 98.3 F (36.8 C) (Oral)   Ht 5\' 5"  (1.651 m)   Wt 211 lb 9.6 oz (96 kg)   SpO2 98%   BMI 35.21 kg/m   Physical Exam Vitals and nursing note reviewed.  Constitutional:      Appearance: Normal appearance.  HENT:     Right Ear: Tympanic membrane, ear canal and external ear normal.     Left Ear: Tympanic membrane, ear canal and external ear normal.     Mouth/Throat:     Mouth: Mucous membranes are moist.     Pharynx: Oropharynx is clear.  Eyes:     Extraocular Movements: Extraocular movements intact.     Pupils: Pupils are equal, round, and reactive to light.  Cardiovascular:     Rate and Rhythm: Normal rate and regular rhythm.     Pulses: Normal pulses.     Heart sounds: Normal heart sounds.  Pulmonary:     Effort: Pulmonary effort is normal.     Breath sounds: Normal breath sounds.  Abdominal:     General: Bowel sounds are normal. There is no distension.     Palpations: There is no mass.     Tenderness: There is no abdominal tenderness.     Hernia: No hernia is present.  Musculoskeletal:     Right lower leg: No edema.     Left lower leg: No edema.  Lymphadenopathy:     Cervical: No cervical adenopathy.  Skin:    General: Skin is warm.  Neurological:     General: No focal deficit present.     Mental Status: She is alert.     Deep Tendon Reflexes:     Reflex Scores:  Bicep reflexes are 2+ on the right side and 2+ on the left side.      Patellar reflexes are 2+ on the right side and 2+ on the left side.    Comments: Bilateral upper and lower extremity strength 5/5  Psychiatric:        Mood and Affect: Mood normal.        Behavior: Behavior normal.        Thought Content: Thought content normal.        Judgment: Judgment normal.         Assessment & Plan:    Problem List Items Addressed This Visit       Cardiovascular and Mediastinum   Chronic migraine w/o aura w/o status migrainosus, not intractable   History of same on budesonide and Imitrex as needed.  Well-controlled on medication.  Patient is followed by [facility] oncology/neurosurgery.      Relevant Medications   celecoxib (CELEBREX) 200 MG capsule   Primary hypertension   Patient currently maintained on amlodipine 2.5 mg daily and metoprolol 100 mg daily.  Blood pressure well-controlled.  Continue medication as prescribed        Digestive   Gastroesophageal reflux disease   Patient currently maintained on omeprazole 20 mg daily.  GERD well-controlled patient does not have to modify her diet.  Continue medication as prescribed        Endocrine   Controlled type 2 diabetes mellitus with hyperglycemia, without long-term current use of insulin (HCC)   Patient currently maintained on Ozempic 2 mg weekly.  Does not check glucose at home.      Hypothyroidism   History of same.  Patient currently maintained on levothyroxine 25 mcg daily        Nervous and Auditory   Chronic low back pain with sciatica   Patient sees physical medicine through [facility] clinic.  Maintained on gabapentin 100 mg nightly.  Continue hide      Relevant Medications   celecoxib (CELEBREX) 200 MG capsule     Other   Memory loss   Secondary to brain tumors or brain surgery.  Patient is currently maintained on donepezil 10 mg.  Continue      Hx of craniotomy   Followed by [facility] oncology continue      Environmental allergies   History of same has been on Singulair 10 extended period time patient makes his symptoms effective.  Will discontinue Singulair for now and start levocetirizine 5 mg nightly.      Relevant Medications   levocetirizine (XYZAL) 5 MG tablet   Obesity (BMI 30-39.9)   History of same.  Will get labs released that were recently drawn from previous per provider      Encounter to  establish care with new doctor - Primary   Unable to review medical records in electronic medical record.  Patient did sign a release to get records       Return in 3 months (on 07/09/2023) for DM recheck.   Linda Nine, NP

## 2023-04-08 NOTE — Assessment & Plan Note (Signed)
 History of same has been on Singulair 10 extended period time patient makes his symptoms effective.  Will discontinue Singulair for now and start levocetirizine 5 mg nightly.

## 2023-04-15 ENCOUNTER — Telehealth: Payer: Self-pay | Admitting: Nurse Practitioner

## 2023-04-15 NOTE — Telephone Encounter (Signed)
 Received records from [facility] regarding patient placed in providers box

## 2023-04-15 NOTE — Telephone Encounter (Signed)
 Picked from providers box to be scanned and abstracted.

## 2023-04-22 DIAGNOSIS — G8929 Other chronic pain: Secondary | ICD-10-CM | POA: Diagnosis not present

## 2023-04-22 DIAGNOSIS — M25552 Pain in left hip: Secondary | ICD-10-CM | POA: Diagnosis not present

## 2023-04-22 DIAGNOSIS — M533 Sacrococcygeal disorders, not elsewhere classified: Secondary | ICD-10-CM | POA: Diagnosis not present

## 2023-04-22 DIAGNOSIS — M5416 Radiculopathy, lumbar region: Secondary | ICD-10-CM | POA: Diagnosis not present

## 2023-04-22 DIAGNOSIS — E119 Type 2 diabetes mellitus without complications: Secondary | ICD-10-CM | POA: Diagnosis not present

## 2023-05-06 DIAGNOSIS — M5416 Radiculopathy, lumbar region: Secondary | ICD-10-CM | POA: Diagnosis not present

## 2023-05-06 DIAGNOSIS — M533 Sacrococcygeal disorders, not elsewhere classified: Secondary | ICD-10-CM | POA: Diagnosis not present

## 2023-05-06 DIAGNOSIS — M25552 Pain in left hip: Secondary | ICD-10-CM | POA: Diagnosis not present

## 2023-05-06 DIAGNOSIS — G8929 Other chronic pain: Secondary | ICD-10-CM | POA: Diagnosis not present

## 2023-05-23 DIAGNOSIS — I10 Essential (primary) hypertension: Secondary | ICD-10-CM | POA: Diagnosis not present

## 2023-05-23 DIAGNOSIS — E1169 Type 2 diabetes mellitus with other specified complication: Secondary | ICD-10-CM | POA: Diagnosis not present

## 2023-05-23 DIAGNOSIS — E039 Hypothyroidism, unspecified: Secondary | ICD-10-CM | POA: Diagnosis not present

## 2023-05-23 DIAGNOSIS — M899 Disorder of bone, unspecified: Secondary | ICD-10-CM | POA: Diagnosis not present

## 2023-06-04 ENCOUNTER — Encounter: Payer: Self-pay | Admitting: Nurse Practitioner

## 2023-06-20 ENCOUNTER — Encounter: Payer: Self-pay | Admitting: Nurse Practitioner

## 2023-06-20 DIAGNOSIS — M25552 Pain in left hip: Secondary | ICD-10-CM | POA: Diagnosis not present

## 2023-06-20 DIAGNOSIS — G8929 Other chronic pain: Secondary | ICD-10-CM | POA: Diagnosis not present

## 2023-06-20 DIAGNOSIS — E119 Type 2 diabetes mellitus without complications: Secondary | ICD-10-CM | POA: Diagnosis not present

## 2023-06-23 ENCOUNTER — Encounter: Payer: Self-pay | Admitting: Nurse Practitioner

## 2023-07-02 DIAGNOSIS — G8929 Other chronic pain: Secondary | ICD-10-CM | POA: Diagnosis not present

## 2023-07-02 DIAGNOSIS — M25552 Pain in left hip: Secondary | ICD-10-CM | POA: Diagnosis not present

## 2023-07-02 DIAGNOSIS — M5416 Radiculopathy, lumbar region: Secondary | ICD-10-CM | POA: Diagnosis not present

## 2023-07-02 DIAGNOSIS — M533 Sacrococcygeal disorders, not elsewhere classified: Secondary | ICD-10-CM | POA: Diagnosis not present

## 2023-07-09 ENCOUNTER — Ambulatory Visit: Admitting: Nurse Practitioner

## 2023-07-09 VITALS — BP 124/88 | HR 80 | Temp 98.3°F | Ht 65.0 in | Wt 213.2 lb

## 2023-07-09 DIAGNOSIS — R0781 Pleurodynia: Secondary | ICD-10-CM | POA: Diagnosis not present

## 2023-07-09 DIAGNOSIS — E1165 Type 2 diabetes mellitus with hyperglycemia: Secondary | ICD-10-CM

## 2023-07-09 DIAGNOSIS — R8290 Unspecified abnormal findings in urine: Secondary | ICD-10-CM

## 2023-07-09 LAB — POCT URINALYSIS DIPSTICK
Bilirubin, UA: NEGATIVE
Blood, UA: NEGATIVE
Glucose, UA: NEGATIVE
Ketones, UA: NEGATIVE
Nitrite, UA: NEGATIVE
Protein, UA: NEGATIVE
Spec Grav, UA: 1.015 (ref 1.010–1.025)
Urobilinogen, UA: 0.2 U/dL
pH, UA: 6 (ref 5.0–8.0)

## 2023-07-09 LAB — POCT GLYCOSYLATED HEMOGLOBIN (HGB A1C): Hemoglobin A1C: 6 % — AB (ref 4.0–5.6)

## 2023-07-09 NOTE — Progress Notes (Signed)
 Established Patient Office Visit  Subjective   Patient ID: Linda Hebert, female    DOB: 02/10/1970  Age: 53 y.o. MRN: [id number]  Chief Complaint  Patient presents with   Diabetes    Pt would like to discuss ozempic.     HPI   DM2: Patient last seen by me on 04/08/2023.  Patient currently maintained on Ozempic 2 mg weekly does not check her blood sugar at home.  Patient's last A1c was 6.7% on 04/01/2023.  She is here for follow-up. States that she feels like it is not doing anything. States that she has not had any weight loss. States that she has seen a nutritionist in the past and did not feel that it was helpful.  States that she will eat 3 meals a day. States that she takes medications 4 times a day. States that if she is not busy her brain says she wants something but when she is busy she is ok She drinks water through the day with electrolytes.   States that May 1st she joined the [employer]. States twice a week with water aeorbics.  Urinary: states past coulle days her urine is cloudy. States that she is not having any other symptoms.  She is drinking plenty of fluid throughout the day.     Review of Systems  Constitutional:  Negative for chills and fever.  Respiratory:  Negative for shortness of breath.   Cardiovascular:  Negative for chest pain.  Gastrointestinal:  Positive for abdominal pain. Negative for constipation.       Bm every other day   Neurological:  Negative for headaches.  Psychiatric/Behavioral:  Negative for hallucinations and suicidal ideas.       Objective:     BP 124/88   Pulse 80   Temp 98.3 F (36.8 C) (Oral)   Ht 5' 5 (1.651 m)   Wt 213 lb 3.2 oz (96.7 kg)   SpO2 97%   BMI 35.48 kg/m  BP Readings from Last 3 Encounters:  07/09/23 124/88  04/08/23 118/78  01/11/23 (!) 148/97   Wt Readings from Last 3 Encounters:  07/09/23 213 lb 3.2 oz (96.7 kg)  04/08/23 211 lb 9.6 oz (96 kg)  01/11/23 210 lb (95.3 kg)   SpO2 Readings from  Last 3 Encounters:  07/09/23 97%  04/08/23 98%  01/11/23 98%      Physical Exam Vitals and nursing note reviewed.  Constitutional:      Appearance: Normal appearance.   Cardiovascular:     Rate and Rhythm: Normal rate and regular rhythm.     Pulses:          Posterior tibial pulses are 2+ on the right side and 2+ on the left side.     Heart sounds: Normal heart sounds.  Pulmonary:     Effort: Pulmonary effort is normal.     Breath sounds: Normal breath sounds.  Chest:     Chest wall: Tenderness present.   Abdominal:     General: There is no distension.     Palpations: There is no mass.     Tenderness: There is no abdominal tenderness. There is no right CVA tenderness or left CVA tenderness.     Hernia: No hernia is present.     Comments: BS WNL   Neurological:     Mental Status: She is alert.     Comments: Bilateral lower extremity strength 5/5     Results for orders placed or performed in  visit on 07/09/23  POCT glycosylated hemoglobin (Hb A1C)  Result Value Ref Range   Hemoglobin A1C 6.0 (A) 4.0 - 5.6 %   HbA1c POC (<> result, manual entry)     HbA1c, POC (prediabetic range)     HbA1c, POC (controlled diabetic range)    POCT urinalysis dipstick  Result Value Ref Range   Color, UA yellow    Clarity, UA cloudy    Glucose, UA Negative Negative   Bilirubin, UA neg    Ketones, UA neg    Spec Grav, UA 1.015 1.010 - 1.025   Blood, UA neg    pH, UA 6.0 5.0 - 8.0   Protein, UA Negative Negative   Urobilinogen, UA 0.2 0.2 or 1.0 E.U./dL   Nitrite, UA neg    Leukocytes, UA Moderate (2+) (A) Negative   Appearance     Odor        The ASCVD Risk score (Arnett DK, et al., 2019) failed to calculate for the following reasons:   The valid total cholesterol range is 130 to 320 mg/dL    Assessment & Plan:   Problem List Items Addressed This Visit       Endocrine   Controlled type 2 diabetes mellitus with hyperglycemia, without long-term current use of insulin  (HCC) - Primary   Patient currently maintained on Ozempic 2 mg weekly.  Patient A1c well-controlled at 6.0%.  Offered to refer patient to nutritionist as she is concerned that she is gaining weight with medication versus losing weight.  Patient politely declined.  Continue work on healthy lifestyle modifications      Relevant Orders   POCT glycosylated hemoglobin (Hb A1C) (Completed)     Other   Cloudy urine   UA in office      Relevant Orders   POCT urinalysis dipstick (Completed)   Rib pain   Unsure of etiology.  No recent sick symptoms.  Patient has tolerated the counter as needed along with ice.      Abnormal urinalysis   Pending urine culture      Relevant Orders   Urine Culture    Return in about 6 months (around 01/08/2024) for DM recheck.    Adina Crandall, NP

## 2023-07-09 NOTE — Assessment & Plan Note (Signed)
 Patient currently maintained on Ozempic 2 mg weekly.  Patient A1c well-controlled at 6.0%.  Offered to refer patient to nutritionist as she is concerned that she is gaining weight with medication versus losing weight.  Patient politely declined.  Continue work on healthy lifestyle modifications

## 2023-07-09 NOTE — Patient Instructions (Addendum)
 Nice to see you today I am going to keep the ozempic where it is  I want to see you in 6 months, sooner if you need me   Try tylenol or ice for the rib pain   I will be in touch with the urine culture once I have it

## 2023-07-09 NOTE — Assessment & Plan Note (Signed)
Pending urine culture.

## 2023-07-09 NOTE — Assessment & Plan Note (Signed)
 UA in office

## 2023-07-09 NOTE — Assessment & Plan Note (Signed)
 Unsure of etiology.  No recent sick symptoms.  Patient has tolerated the counter as needed along with ice.

## 2023-07-10 LAB — URINE CULTURE
MICRO NUMBER:: 16623800
Result:: NO GROWTH
SPECIMEN QUALITY:: ADEQUATE

## 2023-07-11 ENCOUNTER — Ambulatory Visit: Payer: Self-pay | Admitting: Nurse Practitioner

## 2023-07-11 NOTE — Telephone Encounter (Signed)
 Mychart

## 2023-07-13 ENCOUNTER — Encounter: Payer: Self-pay | Admitting: Nurse Practitioner

## 2023-07-14 MED ORDER — VALACYCLOVIR HCL 500 MG PO TABS: 500.0000 mg | ORAL_TABLET | Freq: Every day | ORAL | 3 refills | Status: AC

## 2023-07-21 ENCOUNTER — Ambulatory Visit (INDEPENDENT_AMBULATORY_CARE_PROVIDER_SITE_OTHER): Admitting: Internal Medicine

## 2023-07-21 ENCOUNTER — Encounter: Payer: Self-pay | Admitting: Internal Medicine

## 2023-07-21 VITALS — BP 122/80 | HR 92 | Temp 97.9°F | Ht 65.0 in | Wt 212.0 lb

## 2023-07-21 DIAGNOSIS — J069 Acute upper respiratory infection, unspecified: Secondary | ICD-10-CM | POA: Insufficient documentation

## 2023-07-21 NOTE — Progress Notes (Signed)
 Subjective:    Patient ID: Linda Hebert, female    DOB: 13-Nov-1970, 53 y.o.   MRN: [id number]  HPI Here due to respiratory symptoms  Sick for that past 4 days Started with dry scratchy throat--then this improved Now with congestion, fatigue Having trouble sleeping--not sure why Cough ---junky--but mostly dry No fever, chills. Slight sweats (feeling hot) Mild headache No ear pain--but felt tickle in there Some post nasal/sinus drainage Mild maxillary pain  Using dayquil ---has helped some Vick's stick and ricola cough drops  Current Outpatient Medications on File Prior to Visit  Medication Sig Dispense Refill   amLODipine (NORVASC) 2.5 MG tablet Take 2.5 mg by mouth daily.     calcium carbonate (OSCAL) 1500 (600 Ca) MG TABS tablet Take 600 mg of elemental calcium daily with breakfast by mouth.     celecoxib (CELEBREX) 200 MG capsule Take by mouth.     Cholecalciferol (VITAMIN D-1000 MAX ST) 25 MCG (1000 UT) tablet Take 1 tablet by mouth daily.     donepezil (ARICEPT) 10 MG tablet Take 10 mg by mouth at bedtime.     gabapentin (NEURONTIN) 100 MG capsule Take 100 mg by mouth at bedtime.     hydrOXYzine (ATARAX) 25 MG tablet 1 to 3 tablet at bedtime as needed Orally Once a day for 30 days     levocetirizine (XYZAL ) 5 MG tablet Take 1 tablet (5 mg total) by mouth every evening. 90 tablet 1   levothyroxine  (SYNTHROID , LEVOTHROID) 25 MCG tablet Take 25 mcg by mouth daily.     LORazepam  (ATIVAN ) 0.5 MG tablet Take 0.5 mg by mouth every 6 (six) hours as needed.     MAGNESIUM-ZINC PO Take 250 mg by mouth once.     metoprolol  succinate (TOPROL -XL) 100 MG 24 hr tablet Take 100 mg by mouth daily.     montelukast (SINGULAIR) 10 MG tablet Take 10 mg at bedtime by mouth.  3   Multiple Vitamins-Minerals (ADEKS) chewable tablet Chew 1 tablet by mouth daily.      omeprazole (PRILOSEC) 20 MG capsule Take 20 mg by mouth daily before breakfast.     OZEMPIC, 1 MG/DOSE, 4 MG/3ML SOPN Inject 1  mg into the skin once a week.     Probiotic Product (PROBIOTIC-10 PO) Take by mouth.     rosuvastatin (CRESTOR) 10 MG tablet Take 10 mg by mouth daily.     SUMAtriptan  (IMITREX ) 100 MG tablet Take 100 mg by mouth every 2 (two) hours as needed.     valACYclovir  (VALTREX ) 500 MG tablet Take 1 tablet (500 mg total) by mouth daily. 90 tablet 3   vitamin C (ASCORBIC ACID) 500 MG tablet Take 500 mg by mouth daily.     vitamin E 400 UNIT capsule Take 400 Units by mouth daily.     Zoledronic  Acid (RECLAST  IV) Inject into the vein. Once yearly     zonisamide  (ZONEGRAN ) 100 MG capsule Take 100 mg by mouth 2 (two) times daily.     No current facility-administered medications on file prior to visit.    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   Arthritis 2025   Hip, back, left knee   Brain tumor (benign) (HCC)    Diabetes mellitus    Heart murmur    When I was young   Hypertension    Not sure of date   Hypothyroidism    IBS (irritable bowel syndrome)    Memory loss    Migraine    Osteoporosis 2005   Found with Brain Tumor Surgery    Past Surgical History:  Procedure Laterality Date   BRAIN SURGERY     intraventricular neurocytoma  SP excision in 2010 and 2019 at [facility]  then [facility]   heart ablation  01/15/2004   LIVER BIOPSY     WISDOM TOOTH EXTRACTION      Family History  Problem Relation  Age of Onset   Hypertension Mother    Hypertension Father    Hearing loss Father    Parkinson's disease Maternal Grandmother    Cancer Maternal Grandfather     Social History   Socioeconomic History   Marital status: Divorced    Spouse name: Not on file   Number of children: 0   Years of education: 16   Highest education level: Bachelor's degree (e.g., BA, AB, BS)  Occupational History   Occupation: Out of work on disability  Tobacco Use   Smoking status: Never   Smokeless tobacco: Never  Vaping Use   Vaping status: Never Used  Substance and Sexual Activity   Alcohol use: Yes    Alcohol/week: 2.0 standard drinks of alcohol    Types: 2 Standard drinks or equivalent per week    Comment: occasional - 1/2 drink per week or less   Drug use: No   Sexual activity: Not Currently    Birth control/protection: Other-see comments    Comment: Since Tumor Surgeries I am not able to get pregnant.  Other Topics Concern   Not on file  Social History Narrative   Stays at home   Social Drivers of Health   Financial Resource Strain: Medium Risk (04/01/2023)   Overall Financial Resource Strain (CARDIA)    Difficulty of Paying Living Expenses: Somewhat hard  Food Insecurity: No Food Insecurity (04/01/2023)   Hunger Vital Sign    Worried About Running Out of Food in the Last Year: Never true    Ran Out of Food in the Last Year: Never true  Recent Concern: Food Insecurity - Food Insecurity Present (02/24/2023)   Received from [facility]   Hunger Vital Sign    Within the past 12 months, you worried that your food would run out before you got the money to buy more.: Sometimes true    Within the past 12 months, the food you bought just didn't last and you didn't have money to get more.: Never true  Transportation Needs: No Transportation Needs (04/01/2023)   PRAPARE - Administrator, Civil Service (Medical): No    Lack of Transportation (Non-Medical): No   Physical Activity: Unknown (04/01/2023)   Exercise Vital Sign    Days of Exercise per Week: 0 days    Minutes of Exercise per Session: Not on file  Stress: No  Stress Concern Present (04/01/2023)   [employer] of Occupational Health - Occupational Stress Questionnaire    Feeling of Stress : Only a little  Social Connections: Socially Isolated (04/01/2023)   Social Connection and Isolation Panel    Frequency of Communication with Friends and Family: More than three times a week    Frequency of Social Gatherings with Friends and Family: More than three times a week    Attends Religious Services: Never    Database administrator or Organizations: No    Attends Engineer, structural: Not on file    Marital Status: Divorced  Catering manager Violence: Not on file   Review of Systems No N/V No loss of smell or taste Able to eat     Objective:   Physical Exam Constitutional:      Appearance: Normal appearance.  HENT:     Head:     Comments: No sinus tenderness    Right Ear: Tympanic membrane and ear canal normal.     Left Ear: Tympanic membrane and ear canal normal.     Mouth/Throat:     Comments: Slight pharyngeal injection Pulmonary:     Effort: Pulmonary effort is normal.     Breath sounds: Normal breath sounds. No wheezing or rales.  Musculoskeletal:     Cervical back: Neck supple.  Lymphadenopathy:     Cervical: No cervical adenopathy.  Neurological:     Mental Status: She is alert.            Assessment & Plan:

## 2023-07-21 NOTE — Assessment & Plan Note (Signed)
 Mild and not typical for COVID Continue analgesics and OTC meds that are helping If not improving towards the end of the week, will try empiric doxycycline 

## 2023-07-24 ENCOUNTER — Encounter: Payer: Self-pay | Admitting: Internal Medicine

## 2023-07-24 MED ORDER — DOXYCYCLINE HYCLATE 100 MG PO TABS: 100.0000 mg | ORAL_TABLET | Freq: Two times a day (BID) | ORAL | 0 refills | Status: DC

## 2023-08-14 DIAGNOSIS — K080 Exfoliation of teeth due to systemic causes: Secondary | ICD-10-CM | POA: Diagnosis not present

## 2023-08-27 DIAGNOSIS — M47816 Spondylosis without myelopathy or radiculopathy, lumbar region: Secondary | ICD-10-CM | POA: Diagnosis not present

## 2023-09-05 DIAGNOSIS — M47816 Spondylosis without myelopathy or radiculopathy, lumbar region: Secondary | ICD-10-CM | POA: Diagnosis not present

## 2023-09-15 ENCOUNTER — Other Ambulatory Visit: Payer: Self-pay | Admitting: Nurse Practitioner

## 2023-09-15 DIAGNOSIS — Z9109 Other allergy status, other than to drugs and biological substances: Secondary | ICD-10-CM

## 2023-10-06 ENCOUNTER — Telehealth: Payer: Self-pay | Admitting: Nurse Practitioner

## 2023-10-06 NOTE — Telephone Encounter (Signed)
 I have patient on ozempic 2mg  weekly. Can we verify as the pharmacy requested 1 mg

## 2023-10-06 NOTE — Telephone Encounter (Signed)
 Copied from CRM #[id number]. Topic: Clinical - Medication Refill >> Oct 06, 2023  9:26 AM Laymon HERO wrote: Medication: OZEMPIC, 1 MG/DOSE, 4 MG/3ML SOPN  Has the patient contacted their pharmacy? Yes (Agent: If no, request that the patient contact the pharmacy for the refill. If patient does not wish to contact the pharmacy document the reason why and proceed with request.) (Agent: If yes, when and what did the pharmacy advise?)  This is the patient's preferred pharmacy:  [facility] [address] [place], [place] - [address] [place] [address] Phone: [phone] Fax: [phone]  Is this the correct pharmacy for this prescription? Yes If no, delete pharmacy and type the correct one.   Has the prescription been filled recently? Yes  Is the patient out of the medication? Yes  Has the patient been seen for an appointment in the last year OR does the patient have an upcoming appointment? Yes  Can we respond through MyChart? Yes  Agent: Please be advised that Rx refills may take up to 3 business days. We ask that you follow-up with your pharmacy.

## 2023-10-14 ENCOUNTER — Other Ambulatory Visit: Payer: Self-pay | Admitting: Nurse Practitioner

## 2023-10-14 DIAGNOSIS — Z1231 Encounter for screening mammogram for malignant neoplasm of breast: Secondary | ICD-10-CM

## 2023-10-15 ENCOUNTER — Other Ambulatory Visit: Payer: Self-pay

## 2023-10-15 MED ORDER — SEMAGLUTIDE (2 MG/DOSE) 8 MG/3ML ~~LOC~~ SOPN: 2.0000 mg | PEN_INJECTOR | SUBCUTANEOUS | 1 refills | Status: AC

## 2023-10-15 MED ORDER — DONEPEZIL HCL 10 MG PO TABS: 10.0000 mg | ORAL_TABLET | Freq: Every day | ORAL | 1 refills | Status: AC

## 2023-10-15 MED ORDER — ZONISAMIDE 100 MG PO CAPS
100.0000 mg | ORAL_CAPSULE | Freq: Two times a day (BID) | ORAL | 1 refills | Status: AC
Start: 2023-10-15 — End: ?

## 2023-10-15 NOTE — Telephone Encounter (Signed)
 Copied from CRM #[id number]. Topic: Clinical - Prescription Issue >> Oct 15, 2023 10:09 AM Alfonso HERO wrote: Reason for RMF:Ejupzwu called about OZEMPIC, 1 MG/DOSE, 4 MG/3ML SOPN - verified with patient it needs to be 2 MG  She also stated she needs a refill for the following 2 medications as well.  donepezil (ARICEPT) 10 MG tablet zonisamide  (ZONEGRAN ) 100 MG capsule

## 2023-10-24 ENCOUNTER — Ambulatory Visit: Payer: Self-pay

## 2023-10-24 NOTE — Telephone Encounter (Signed)
 Appointment with Linda Hebert on 10/27/23.

## 2023-10-24 NOTE — Telephone Encounter (Signed)
 FYI Only or Action Required?: FYI only for provider.  Patient was last seen in primary care on 07/21/2023 by Jimmy Charlie FERNS, MD.  Called Nurse Triage reporting URI and Back Pain.  Symptoms began a week ago.  Interventions attempted: Rest, hydration, or home remedies.  Symptoms are: gradually worsening.  Triage Disposition: See PCP When Office is Open (Within 3 Days)  Patient/caregiver understands and will follow disposition?: Yes Reason for Disposition  Lots of coughing  Answer Assessment - Initial Assessment Questions Also mentions chronic sciatica causing difficulty sleeping. Is not taking anything OTC for sinus & congestion. Is taking celebrex for her back pain, also receives injections. Pt's current temp is 98.3.   1. LOCATION: Where does it hurt?      Sinus pain, pressure, cough, congestion.   2. ONSET: When did the sinus pain start?  (e.g., hours, days)      One week  3. FEVER: Do you have a fever? If Yes, ask: What is it, how was it measured, and when did it start?      Denies  4. OTHER SYMPTOMS: Do you have any other symptoms? (e.g., sore throat, cough, earache, difficulty breathing)     Same 2 weeks ago, took mucinex  fast max cold & flu. Previous episode resolved in 7 days.  Protocols used: Sinus Pain or Congestion-A-AH Copied from CRM #[id number]. Topic: Clinical - Red Word Triage >> Oct 24, 2023 11:34 AM Dedra B wrote: Red Word that prompted transfer to Nurse Triage: Pt has productive cough, congestion, sinus drainage, really tired but difficulty sleepy. Warm transfer to NT.

## 2023-10-24 NOTE — Telephone Encounter (Signed)
 noted

## 2023-10-27 ENCOUNTER — Ambulatory Visit: Admitting: General Practice

## 2023-10-27 ENCOUNTER — Encounter: Payer: Self-pay | Admitting: General Practice

## 2023-10-27 ENCOUNTER — Ambulatory Visit: Payer: Self-pay | Admitting: General Practice

## 2023-10-27 VITALS — BP 112/80 | HR 85 | Temp 98.0°F | Ht 65.0 in | Wt 211.0 lb

## 2023-10-27 DIAGNOSIS — R051 Acute cough: Secondary | ICD-10-CM | POA: Diagnosis not present

## 2023-10-27 DIAGNOSIS — Z9109 Other allergy status, other than to drugs and biological substances: Secondary | ICD-10-CM | POA: Diagnosis not present

## 2023-10-27 LAB — POCT INFLUENZA A/B
Influenza A, POC: NEGATIVE
Influenza B, POC: NEGATIVE

## 2023-10-27 LAB — POC COVID19 BINAXNOW: SARS Coronavirus 2 Ag: NEGATIVE

## 2023-10-27 MED ORDER — FLUTICASONE PROPIONATE 50 MCG/ACT NA SUSP
2.0000 | Freq: Every day | NASAL | 0 refills | Status: AC
Start: 2023-10-27 — End: ?

## 2023-10-27 NOTE — Patient Instructions (Addendum)
 You can try a few things over the counter to help with your symptoms including:  Cough: Delsym or Robitussin (get the off brand, works just as well) Chest Congestion: Mucinex  (plain) Nasal Congestion/Ear Pressure/Sinus Pressure: Try using Flonase  (fluticasone ) nasal spray. Instill 1 spray in each nostril twice daily. This can be purchased over the counter. Body aches, fevers, headache: Ibuprofen (not to exceed 2400 mg in 24 hours) or Acetaminophen-Tylenol (not to exceed 3000 mg in 24 hours) Runny Nose/Throat Drainage/Sneezing/Itchy or Watery Eyes: An antihistamine such as Zyrtec, Claritin, Xyzal , Allegra   Rest, increase fluid intake and use humidifier.   Please update me if your symptoms are not better by the end of the week.   It was a pleasure meeting you!

## 2023-10-27 NOTE — Progress Notes (Signed)
 Established Patient Office Visit  Subjective   Patient ID: Linda Hebert, female    DOB: April 05, 1970  Age: 53 y.o. MRN: [id number]  Chief Complaint  Patient presents with   Cough    That's been productive, fatigue, runny nose, nasal/sinus congestion and drainage; diarrhea. Had 3 weeks ago and went away after about 7 days and returned Thursday afternoon.    Cough Associated symptoms include a sore throat. Pertinent negatives include no chest pain, chills, ear pain, fever, headaches, heartburn or shortness of breath.    Linda Hebert is a 53 year old female, patient of Adina Crandall, NP, with past medical history of GERD, DM2, chronic back pain, HTN, obesity presents today for an acute visit.   Discussed the use of AI scribe software for clinical note transcription with the patient, who gave verbal consent to proceed.  History of Present Illness Linda Hebert is a 53 year old female who presents with respiratory symptoms and postnasal drip.  She began experiencing respiratory symptoms on Thursday afternoon, including a sore throat, postnasal drip, and a burning sensation in the back of her throat. The postnasal drip produces clear to greenish mucus, with one instance of reddish-brown mucus. She also has a runny nose, sinus pressure, and fatigue, particularly worsening in the afternoons.  Three weeks ago, she experienced similar symptoms, including coughing, runny nose, nasal congestion, sinus congestion, drainage, and diarrhea. She notes a slight recurrence of diarrhea with the current symptoms. She has been using Xyzal  for allergies, which was switched from Zyrtec earlier this year. She has not been using Flonase  recently but has used it occasionally when experiencing significant congestion.  No ear pain, dizziness, or significant cough. No fever or chills. She experiences watery eyes and some throat irritation.  She has a history of two brain tumor surgeries, which have  affected her short-term memory.    Patient Active Problem List   Diagnosis Date Noted   Viral URI with cough 07/21/2023   Cloudy urine 07/09/2023   Rib pain 07/09/2023   Abnormal urinalysis 07/09/2023   Environmental allergies 04/08/2023   Chronic low back pain with sciatica 04/08/2023   Controlled type 2 diabetes mellitus with hyperglycemia, without long-term current use of insulin (HCC) 04/08/2023   Obesity (BMI 30-39.9) 04/08/2023   Hypothyroidism 04/08/2023   Primary hypertension 04/08/2023   Gastroesophageal reflux disease 04/08/2023   Encounter to establish care with new doctor 04/08/2023   Chronic migraine w/o aura w/o status migrainosus, not intractable 05/18/2020   History of craniotomy 05/18/2020   Vertigo 11/17/2016   Nausea & vomiting 11/17/2016   Hypokalemia 11/17/2016   Memory loss 12/22/2014   Hx of craniotomy 12/22/2014   Type II or unspecified type diabetes mellitus without mention of complication, not stated as uncontrolled 06/07/2013   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   Arthritis 2025   Hip, back, left knee   Brain tumor (benign) (HCC)    Diabetes mellitus    Heart murmur    When I was young   Hypertension    Not sure of date   Hypothyroidism    IBS (irritable bowel syndrome)    Memory loss    Migraine    Osteoporosis 2005   Found with Brain Tumor Surgery   Past Surgical History:  Procedure Laterality Date  BRAIN SURGERY     intraventricular neurocytoma  SP excision in 2010 and 2019 at [facility]  then RT   heart ablation  01/15/2004   LIVER BIOPSY     WISDOM TOOTH EXTRACTION     Allergies  Allergen Reactions   Morphine And Codeine Nausea Only    GI Upset    Propoxyphene Nausea And Vomiting and Nausea Only   Bactrim Nausea And Vomiting   Sulfamethoxazole-Trimethoprim Nausea And Vomiting and Other (See  Comments)        Amoxicillin -Pot Clavulanate     Other reaction(s): diarrhea   Buprenorphine Hcl Other (See Comments)   Ciprofloxacin Nausea And Vomiting   Other Other (See Comments)    All narcotics GI upset   Codeine Nausea Only         10/27/2023   10:45 AM 07/09/2023   10:26 AM 04/08/2023   10:16 AM  Depression screen PHQ 2/9  Decreased Interest 0 0 0  Down, Depressed, Hopeless 0 0 0  PHQ - 2 Score 0 0 0  Altered sleeping 3 2 3   Tired, decreased energy 2 2 1   Change in appetite 0 0 0  Feeling bad or failure about yourself  0 0 0  Trouble concentrating 1 0 0  Moving slowly or fidgety/restless 0 0 0  Suicidal thoughts 0 0 0  PHQ-9 Score 6 4 4   Difficult doing work/chores Somewhat difficult Somewhat difficult        10/27/2023   10:45 AM 07/09/2023   10:26 AM 04/08/2023   10:17 AM  GAD 7 : Generalized Anxiety Score  Nervous, Anxious, on Edge 0 1 0  Control/stop worrying 0 1 0  Worry too much - different things 0 1 0  Trouble relaxing 1 1 0  Restless 1 1 0  Easily annoyed or irritable 1 1 0  Afraid - awful might happen 0 0 0  Total GAD 7 Score 3 6 0  Anxiety Difficulty Somewhat difficult Somewhat difficult Not difficult at all      Review of Systems  Constitutional:  Positive for malaise/fatigue. Negative for chills and fever.  HENT:  Positive for congestion and sore throat. Negative for ear pain and sinus pain.        Sinus pressure.  Respiratory:  Positive for cough. Negative for shortness of breath.   Cardiovascular:  Negative for chest pain.  Gastrointestinal:  Positive for diarrhea. Negative for abdominal pain, constipation, heartburn, nausea and vomiting.  Genitourinary:  Negative for dysuria, frequency and urgency.  Neurological:  Negative for dizziness and headaches.  Endo/Heme/Allergies:  Negative for polydipsia.  Psychiatric/Behavioral:  Negative for depression and suicidal ideas. The patient is not nervous/anxious.       Objective:     BP  112/80   Pulse 85   Temp 98 F (36.7 C) (Oral)   Ht 5' 5 (1.651 m)   Wt 211 lb (95.7 kg)   SpO2 95%   BMI 35.11 kg/m  BP Readings from Last 3 Encounters:  10/27/23 112/80  07/21/23 122/80  07/09/23 124/88   Wt Readings from Last 3 Encounters:  10/27/23 211 lb (95.7 kg)  07/21/23 212 lb (96.2 kg)  07/09/23 213 lb 3.2 oz (96.7 kg)      Physical Exam Vitals and nursing note reviewed.  Constitutional:      Appearance: Normal appearance.  HENT:     Right Ear: Tympanic membrane, ear canal and external ear normal.     Left Ear: Tympanic membrane, ear  canal and external ear normal.     Nose: Congestion and rhinorrhea present.     Right Sinus: No maxillary sinus tenderness or frontal sinus tenderness.     Left Sinus: No maxillary sinus tenderness or frontal sinus tenderness.     Mouth/Throat:     Mouth: Mucous membranes are moist.     Pharynx: Posterior oropharyngeal erythema and postnasal drip present.  Eyes:     Pupils: Pupils are equal, round, and reactive to light.  Cardiovascular:     Rate and Rhythm: Normal rate and regular rhythm.     Pulses: Normal pulses.     Heart sounds: Normal heart sounds.  Pulmonary:     Effort: Pulmonary effort is normal.     Breath sounds: Normal breath sounds.  Neurological:     Mental Status: She is alert and oriented to person, place, and time.  Psychiatric:        Mood and Affect: Mood normal.        Behavior: Behavior normal.        Thought Content: Thought content normal.        Judgment: Judgment normal.      Results for orders placed or performed in visit on 10/27/23  POC COVID-19  Result Value Ref Range   SARS Coronavirus 2 Ag Negative Negative  POCT Influenza A/B  Result Value Ref Range   Influenza A, POC Negative Negative   Influenza B, POC Negative Negative       The ASCVD Risk score (Arnett DK, et al., 2019) failed to calculate for the following reasons:   The valid total cholesterol range is 130 to 320 mg/dL     Assessment & Plan:  Environmental allergies -     Fluticasone  Propionate; Place 2 sprays into both nostrils daily.  Dispense: 16 g; Refill: 0  Acute cough -     POC COVID-19 BinaxNow -     POCT Influenza A/B    Assessment and Plan Assessment & Plan Acute upper respiratory infection with postnasal drip and allergic rhinitis Symptoms consistent with viral infection, day five. Differential includes bacterial infection, but presentation suggests viral etiology. Postnasal drip causing throat irritation. Allergic rhinitis contributing to symptoms. - POC Covid and flu negative. - Prescribe Flonase  for nasal congestion and postnasal drip. - Advise Mucinex  plain for chest congestion if needed. - Recommend Delsym or Robitussin for cough if needed. - Continue Xyzal  daily; consider switching back to Zyrtec if symptoms persist. - Encourage rest, increased fluid intake, and use of humidifier at night. - Instruct to update provider by end of week if no improvement or new symptoms develop.   No follow-ups on file.    Carrol Aurora, NP

## 2023-11-06 DIAGNOSIS — M47816 Spondylosis without myelopathy or radiculopathy, lumbar region: Secondary | ICD-10-CM | POA: Diagnosis not present

## 2023-11-06 DIAGNOSIS — Z79899 Other long term (current) drug therapy: Secondary | ICD-10-CM | POA: Diagnosis not present

## 2023-12-02 ENCOUNTER — Ambulatory Visit
Admission: RE | Admit: 2023-12-02 | Discharge: 2023-12-02 | Disposition: A | Discharge status: Home / Self Care | Source: Ambulatory Visit | Attending: Nurse Practitioner | Admitting: Nurse Practitioner

## 2023-12-02 DIAGNOSIS — Z1151 Encounter for screening for human papillomavirus (HPV): Secondary | ICD-10-CM | POA: Diagnosis not present

## 2023-12-02 DIAGNOSIS — Z6834 Body mass index (BMI) 34.0-34.9, adult: Secondary | ICD-10-CM | POA: Diagnosis not present

## 2023-12-02 DIAGNOSIS — Z1231 Encounter for screening mammogram for malignant neoplasm of breast: Secondary | ICD-10-CM | POA: Diagnosis not present

## 2023-12-02 DIAGNOSIS — Z01419 Encounter for gynecological examination (general) (routine) without abnormal findings: Secondary | ICD-10-CM | POA: Diagnosis not present

## 2023-12-02 DIAGNOSIS — R87810 Cervical high risk human papillomavirus (HPV) DNA test positive: Secondary | ICD-10-CM | POA: Diagnosis not present

## 2023-12-02 DIAGNOSIS — N951 Menopausal and female climacteric states: Secondary | ICD-10-CM | POA: Diagnosis not present

## 2023-12-02 DIAGNOSIS — Z124 Encounter for screening for malignant neoplasm of cervix: Secondary | ICD-10-CM | POA: Diagnosis not present

## 2023-12-05 ENCOUNTER — Ambulatory Visit: Payer: Self-pay | Admitting: Nurse Practitioner

## 2023-12-07 ENCOUNTER — Other Ambulatory Visit: Payer: Self-pay | Admitting: Nurse Practitioner

## 2023-12-07 DIAGNOSIS — Z9109 Other allergy status, other than to drugs and biological substances: Secondary | ICD-10-CM

## 2023-12-10 DIAGNOSIS — D332 Benign neoplasm of brain, unspecified: Secondary | ICD-10-CM | POA: Diagnosis not present

## 2023-12-10 DIAGNOSIS — H2513 Age-related nuclear cataract, bilateral: Secondary | ICD-10-CM | POA: Diagnosis not present

## 2023-12-10 DIAGNOSIS — E119 Type 2 diabetes mellitus without complications: Secondary | ICD-10-CM | POA: Diagnosis not present

## 2023-12-10 DIAGNOSIS — H43813 Vitreous degeneration, bilateral: Secondary | ICD-10-CM | POA: Diagnosis not present

## 2023-12-10 LAB — OPHTHALMOLOGY REPORT-SCANNED

## 2023-12-18 DIAGNOSIS — R87810 Cervical high risk human papillomavirus (HPV) DNA test positive: Secondary | ICD-10-CM | POA: Diagnosis not present

## 2024-01-01 ENCOUNTER — Ambulatory Visit: Admitting: Nurse Practitioner

## 2024-01-01 VITALS — BP 132/80 | HR 65 | Temp 97.9°F | Ht 65.0 in | Wt 211.8 lb

## 2024-01-01 DIAGNOSIS — E1165 Type 2 diabetes mellitus with hyperglycemia: Secondary | ICD-10-CM | POA: Diagnosis not present

## 2024-01-01 DIAGNOSIS — G43709 Chronic migraine without aura, not intractable, without status migrainosus: Secondary | ICD-10-CM

## 2024-01-01 DIAGNOSIS — I10 Essential (primary) hypertension: Secondary | ICD-10-CM | POA: Diagnosis not present

## 2024-01-01 LAB — POCT GLYCOSYLATED HEMOGLOBIN (HGB A1C): Hemoglobin A1C: 5.9 % — AB (ref 4.0–5.6)

## 2024-01-01 MED ORDER — ZONISAMIDE 100 MG PO CAPS: ORAL_CAPSULE | ORAL | Status: AC

## 2024-01-01 MED ORDER — ROSUVASTATIN CALCIUM 10 MG PO TABS: 10.0000 mg | ORAL_TABLET | Freq: Every day | ORAL | 2 refills | Status: AC

## 2024-01-01 MED ORDER — AMLODIPINE BESYLATE 2.5 MG PO TABS: 2.5000 mg | ORAL_TABLET | Freq: Every day | ORAL | 2 refills | Status: AC

## 2024-01-01 NOTE — Progress Notes (Signed)
 Established Patient Office Visit  Subjective   Patient ID: Linda Hebert, female    DOB: [birth year]  Age: 53 y.o. MRN: [id number]  Chief Complaint  Patient presents with   Diabetes   Medication Management    Discussed the use of AI scribe software for clinical note transcription with the patient, who gave verbal consent to proceed.  History of Present Illness Linda Hebert is a 53 year old female with diabetes who presents for follow-up on her blood sugar levels and medication management.  She manages her diabetes with Ozempic  2 mg weekly and experiences no side effects such as constipation, nausea, or vomiting. She does not monitor her blood sugar at home but feels her levels are stable. Recent lab results showed a blood sugar level of 99, and her A1c was 5.9.  She recently underwent an ablation procedure a week ago and is managing post-procedure discomfort with Celebrex and arthritis Tylenol taken twice daily. She experiences fatigue and a tired feeling radiating from her back down to her legs, causing her legs to feel weak. Her knee occasionally gives out due to existing knee trouble.  She takes zonisamide  for migraine prevention, although she has not experienced migraines for several years. There is a discrepancy in her prescription instructions, as she has been taking two capsules at bedtime, but the new prescription indicates one capsule twice a day. She continues with her usual regimen until the prescription is updated.  Her bowel movements occur daily or every other day, with no significant issues, although she experienced some difficulty following her recent procedure.  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 She consumes three meals a day with occasional snacks and drinks decaf coffee at home and regular coffee when volunteering.     Review of Systems  Constitutional:  Negative for chills  and fever.  Respiratory:  Negative for shortness of breath.   Cardiovascular:  Negative for chest pain.  Gastrointestinal:  Negative for abdominal pain, constipation, nausea and vomiting.  Neurological:  Negative for dizziness and headaches.      Objective:     BP 132/80   Pulse 65   Temp 97.9 F (36.6 C) (Oral)   Ht 5' 5 (1.651 m)   Wt 211 lb 12.8 oz (96.1 kg)   SpO2 99%   BMI 35.25 kg/m  BP Readings from Last 3 Encounters:  01/01/24 132/80  10/27/23 112/80  07/21/23 122/80   Wt Readings from Last 3 Encounters:  01/01/24 211 lb 12.8 oz (96.1 kg)  10/27/23 211 lb (95.7 kg)  07/21/23 212 lb (96.2 kg)   SpO2 Readings from Last 3 Encounters:  01/01/24 99%  10/27/23 95%  07/21/23 97%      Physical Exam Vitals and nursing note reviewed.  Constitutional:      Appearance: Normal appearance.  Cardiovascular:     Rate and Rhythm: Normal rate and regular rhythm.     Heart sounds: Normal heart sounds.  Pulmonary:     Effort: Pulmonary effort is normal.     Breath sounds: Normal breath sounds.  Abdominal:     General: Bowel sounds are normal.  Neurological:     Mental Status: She is alert.      Results for orders placed or performed in visit on 01/01/24  POCT glycosylated hemoglobin (Hb A1C)  Result Value Ref Range   Hemoglobin A1C 5.9 (A) 4.0 - 5.6 %   HbA1c  POC (<> result, manual entry)     HbA1c, POC (prediabetic range)     HbA1c, POC (controlled diabetic range)        The ASCVD Risk score (Arnett DK, et al., 2019) failed to calculate for the following reasons:   The valid total cholesterol range is 130 to 320 mg/dL   * - Cholesterol units were assumed    Assessment & Plan:   Problem List Items Addressed This Visit       Cardiovascular and Mediastinum   Chronic migraine w/o aura w/o status migrainosus, not intractable   Relevant Medications   zonisamide  (ZONEGRAN ) 100 MG capsule   amLODipine  (NORVASC ) 2.5 MG tablet   rosuvastatin  (CRESTOR ) 10  MG tablet   Primary hypertension   Relevant Medications   amLODipine  (NORVASC ) 2.5 MG tablet   rosuvastatin  (CRESTOR ) 10 MG tablet     Endocrine   Controlled type 2 diabetes mellitus with hyperglycemia, without long-term current use of insulin (HCC) - Primary   Relevant Medications   rosuvastatin  (CRESTOR ) 10 MG tablet   Other Relevant Orders   POCT glycosylated hemoglobin (Hb A1C) (Completed)   Assessment and Plan Assessment & Plan Post-procedural Pain Pain managed with Celebrex and arthritis Tylenol. Considering tapering off medications. - Continue Celebrex and arthritis Tylenol as needed for pain management.  Type 2 Diabetes Mellitus Well-controlled with A1c of 5.9. On Ozempic  2 mg weekly, no side effects reported. No symptoms of hypo/hyperglycemia. - Continue Ozempic  2 mg weekly. - Monitor A1c and blood sugar levels as needed.  Hypertension Blood pressure managed with metoprolol . - Refill metoprolol  for 90 days.  Hyperlipidemia On rosuvastatin , requires refill. - Refill rosuvastatin  for 90 days.  Migraine Prophylaxis No migraines for a few years on zonisamide . Prescription instructions need correction. - Continue zonisamide  as currently prescribed. - Update prescription instructions to reflect current dosing regimen.   Return in about 3 months (around 03/31/2024) for CPE and Labs.    Adina Crandall, NP

## 2024-01-01 NOTE — Patient Instructions (Signed)
 Nice to see you today Your A1C was great at 5.9% I want to see you in 3 months for your physical and labs, sooner if you need me

## 2024-02-05 ENCOUNTER — Ambulatory Visit

## 2024-02-05 ENCOUNTER — Telehealth: Payer: Self-pay | Admitting: Nurse Practitioner

## 2024-02-05 VITALS — Ht 65.0 in | Wt 211.0 lb

## 2024-02-05 DIAGNOSIS — Z0000 Encounter for general adult medical examination without abnormal findings: Secondary | ICD-10-CM

## 2024-02-05 NOTE — Patient Instructions (Addendum)
 Linda Hebert,  Thank you for taking the time for your Medicare Wellness Visit. I appreciate your continued commitment to your health goals. Please review the care plan we discussed, and feel free to reach out if I can assist you further.  Please note that Annual Wellness Visits do not include a physical exam. Some assessments may be limited, especially if the visit was conducted virtually. If needed, we may recommend an in-person follow-up with your provider.  Ongoing Care Seeing your primary care provider every 3 to 6 months helps us  monitor your health and provide consistent, personalized care. Next office visit on 04/01/2024.  You are due for a pneumonia vaccine and a Hep B vaccine, please discuss with PCP during your next office visit.  You are due for a foot exam and will have that done during your next office visit.  Remember to bring in documentation or results from last colonoscopy.  Aim for 30 minutes of exercise or brisk walking, 6-8 glasses of water, and 5 servings of fruits and vegetables each day.   Referrals If a referral was made during today's visit and you haven't received any updates within two weeks, please contact the referred provider directly to check on the status.  Recommended Screenings:  Health Maintenance  Topic Date Due   Complete foot exam   Never done   HIV Screening  Never done   Hepatitis B Vaccine (1 of 3 - 19+ 3-dose series) Never done   Colon Cancer Screening  Never done   Pap with HPV screening  08/01/2017   Pneumococcal Vaccine for age over 41 (3 of 3 - PCV20 or PCV21) 01/20/2022   COVID-19 Vaccine (6 - 2025-26 season) 09/15/2023   Medicare Annual Wellness Visit  03/31/2024   Kidney health urinalysis for diabetes  03/24/2024   Yearly kidney function blood test for diabetes  03/31/2024   Hemoglobin A1C  07/01/2024   Eye exam for diabetics  12/09/2024   Breast Cancer Screening  12/01/2025   DTaP/Tdap/Td vaccine (4 - Td or Tdap) 06/12/2030   Flu Shot   Completed   HPV Vaccine (No Doses Required) Completed   Hepatitis C Screening  Completed   Zoster (Shingles) Vaccine  Completed   Meningitis B Vaccine  Aged Out       02/05/2024   11:56 AM  Advanced Directives  Does Patient Have a Medical Advance Directive? Yes  Type of Estate Agent of [place];Living will  Copy of Healthcare Power of Attorney in Chart? No - copy requested    Vision: Annual vision screenings are recommended for early detection of glaucoma, cataracts, and diabetic retinopathy. These exams can also reveal signs of chronic conditions such as diabetes and high blood pressure.  Dental: Annual dental screenings help detect early signs of oral cancer, gum disease, and other conditions linked to overall health, including heart disease and diabetes.  Please see the attached documents for additional preventive care recommendations.

## 2024-02-05 NOTE — Progress Notes (Signed)
 "  Chief Complaint  Patient presents with   Medicare Wellness     Subjective:   Linda Hebert is a 54 y.o. female who presents for a [employer] Visit.  Visit info / Clinical Intake: Medicare Wellness Visit Type:: Initial Annual Wellness Visit Persons participating in visit and providing information:: patient Medicare Wellness Visit Mode:: Telephone If telephone:: video declined Since this visit was completed virtually, some vitals may be partially provided or unavailable. Missing vitals are due to the limitations of the virtual format.: Documented vitals are patient reported If Telephone or Video please confirm:: I connected with patient using audio/video enable telemedicine. I verified patient identity with two identifiers, discussed telehealth limitations, and patient agreed to proceed. Patient Location:: Home Provider Location:: Office Interpreter Needed?: No Pre-visit prep was completed: yes AWV questionnaire completed by patient prior to visit?: no Living arrangements:: (!) lives alone Patient's Overall Health Status Rating: good Typical amount of pain: some Does pain affect daily life?: (!) yes (lower back) Are you currently prescribed opioids?: no  Dietary Habits and Nutritional Risks How many meals a day?: 3 Eats fruit and vegetables daily?: yes Most meals are obtained by: having others provide food (mother prepares the food) In the last 2 weeks, have you had any of the following?: (!) nausea, vomiting, diarrhea (diarrhea) Diabetic:: (!) yes Any non-healing wounds?: no How often do you check your BS?: 0 (only at [facility] office) Would you like to be referred to a Nutritionist or for Diabetic Management? : no  Functional Status Activities of Daily Living (to include ambulation/medication): Independent Ambulation: Independent with device- listed below Home Assistive Devices/Equipment: Eyeglasses Medication Administration: Independent Home Management  (perform basic housework or laundry): Independent Manage your own finances?: yes Primary transportation is: driving Concerns about vision?: no *vision screening is required for WTM* Concerns about hearing?: no  Fall Screening Falls in the past year?: 0 Number of falls in past year: 0 Was there an injury with Fall?: 0 Fall Risk Category Calculator: 0 Patient Fall Risk Level: Low Fall Risk  Fall Risk Patient at Risk for Falls Due to: No Fall Risks Fall risk Follow up: Falls evaluation completed; Falls prevention discussed  Home and Transportation Safety: All rugs have non-skid backing?: N/A, no rugs All stairs or steps have railings?: yes (outside steps) Grab bars in the bathtub or shower?: (!) no Have non-skid surface in bathtub or shower?: yes Good home lighting?: yes Regular seat belt use?: yes Hospital stays in the last year:: no  Cognitive Assessment Difficulty concentrating, remembering, or making decisions? : yes Will 6CIT or Mini Cog be Completed: no 6CIT or Mini Cog Declined: patient has a diagnosis of dementia or cognitive impairment  Advance Directives (For Healthcare) Does Patient Have a Medical Advance Directive?: Yes Type of Advance Directive: Healthcare Power of [place]; Living will Copy of Healthcare Power of Attorney in Chart?: No - copy requested Copy of Living Will in Chart?: No - copy requested  Reviewed/Updated  Reviewed/Updated: Reviewed All (Medical, Surgical, Family, Medications, Allergies, Care Teams, Patient Goals)    Allergies (verified) Fentanyl, Morphine and codeine, Propoxyphene, Bactrim, Sulfamethoxazole-trimethoprim, Amoxicillin -pot clavulanate, Buprenorphine hcl, Ciprofloxacin, Other, and Codeine   Current Medications (verified) Outpatient Encounter Medications as of 02/05/2024  Medication Sig   amLODipine  (NORVASC ) 2.5 MG tablet Take 1 tablet (2.5 mg total) by mouth daily.   calcium  carbonate (OSCAL) 1500 (600 Ca) MG TABS tablet Take  600 mg of elemental calcium  daily with breakfast by mouth.   celecoxib (CELEBREX)  200 MG capsule Take by mouth.   Cholecalciferol (VITAMIN D-1000 MAX ST) 25 MCG (1000 UT) tablet Take 1 tablet by mouth daily.   donepezil  (ARICEPT ) 10 MG tablet Take 1 tablet (10 mg total) by mouth at bedtime.   fluticasone  (FLONASE ) 50 MCG/ACT nasal spray Place 2 sprays into both nostrils daily.   gabapentin (NEURONTIN) 100 MG capsule Take 100 mg by mouth at bedtime.   levocetirizine (XYZAL ) 5 MG tablet TAKE 1 TABLET BY MOUTH ONCE DAILY IN THE EVENING   levothyroxine  (SYNTHROID , LEVOTHROID) 25 MCG tablet Take 25 mcg by mouth daily.   LORazepam  (ATIVAN ) 0.5 MG tablet Take 0.5 mg by mouth every 6 (six) hours as needed.   MAGNESIUM-ZINC PO Take 250 mg by mouth once.   metoprolol  succinate (TOPROL -XL) 100 MG 24 hr tablet Take 100 mg by mouth daily.   montelukast (SINGULAIR) 10 MG tablet Take 10 mg at bedtime by mouth.   Multiple Vitamins-Minerals (ADEKS) chewable tablet Chew 1 tablet by mouth daily.    omeprazole (PRILOSEC) 20 MG capsule Take 20 mg by mouth daily before breakfast.   Probiotic Product (PROBIOTIC-10 PO) Take by mouth.   rosuvastatin  (CRESTOR ) 10 MG tablet Take 1 tablet (10 mg total) by mouth daily.   Semaglutide , 2 MG/DOSE, 8 MG/3ML SOPN Inject 2 mg as directed once a week.   SUMAtriptan  (IMITREX ) 100 MG tablet Take 100 mg by mouth every 2 (two) hours as needed.   valACYclovir  (VALTREX ) 500 MG tablet Take 1 tablet (500 mg total) by mouth daily.   vitamin C (ASCORBIC ACID) 500 MG tablet Take 500 mg by mouth daily.   vitamin E 400 UNIT capsule Take 400 Units by mouth daily.   Zoledronic  Acid (RECLAST  IV) Inject into the vein. Once yearly   zonisamide  (ZONEGRAN ) 100 MG capsule Takes 2 capsules at bedtime   No facility-administered encounter medications on file as of 02/05/2024.    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   Arthritis 2025   Hip, back, left knee   Brain tumor (benign) (HCC)    Diabetes mellitus    Heart murmur    When I was young   Hypertension    Not sure of date   Hypothyroidism    IBS (irritable bowel syndrome)    Memory loss    Migraine    Osteoporosis 2005   Found with Brain Tumor Surgery   Past Surgical History:  Procedure Laterality Date   BRAIN SURGERY     intraventricular neurocytoma  SP excision in 2010 and 2019 at [facility]  then [facility]   heart ablation  01/15/2004   LIVER BIOPSY     WISDOM TOOTH EXTRACTION     Family History  Problem Relation Age of Onset   Hypertension Mother    Hypertension Father    Hearing loss Father    Parkinson's disease Maternal Grandmother    Cancer Maternal Grandfather    Social History   Occupational History   Occupation: Out of work on disability  Tobacco Use   Smoking status: Never   Smokeless tobacco: Never  Vaping Use   Vaping status: Never Used  Substance and Sexual Activity   Alcohol use: Yes    Alcohol/week: 2.0 standard drinks of alcohol    Types: 2 Standard drinks or equivalent per week    Comment:  occasional - 1/2 drink per week or less   Drug use: No   Sexual activity: Not Currently    Birth control/protection: Other-see comments    Comment: Since Tumor Surgeries I am not able to get pregnant.   Tobacco Counseling Counseling given: Not Answered  SDOH Screenings   Food Insecurity: No Food Insecurity (02/05/2024)  Housing: Low Risk (02/05/2024)  Transportation Needs: No Transportation Needs (02/05/2024)  Utilities: Not At Risk (02/05/2024)  Alcohol Screen: Low Risk (12/31/2023)  Depression (PHQ2-9): Low Risk (02/05/2024)  Financial Resource Strain: Low Risk (12/31/2023)  Physical Activity: Inactive (02/05/2024)  Social Connections: Socially Isolated (02/05/2024)  Stress: No Stress Concern Present (02/05/2024)  Tobacco Use: Low Risk (02/05/2024)   Health Literacy: Adequate Health Literacy (02/05/2024)   See flowsheets for full screening details  Depression Screen PHQ 2 & 9 Depression Scale- Over the past 2 weeks, how often have you been bothered by any of the following problems? Little interest or pleasure in doing things: 0 Feeling down, depressed, or hopeless (PHQ Adolescent also includes...irritable): 0 PHQ-2 Total Score: 0 Trouble falling or staying asleep, or sleeping too much: 0 Feeling tired or having little energy: 0 Poor appetite or overeating (PHQ Adolescent also includes...weight loss): 0 Feeling bad about yourself - or that you are a failure or have let yourself or your family down: 0 Trouble concentrating on things, such as reading the newspaper or watching television (PHQ Adolescent also includes...like school work): 0 Moving or speaking so slowly that other people could have noticed. Or the opposite - being so fidgety or restless that you have been moving around a lot more than usual: 0 Thoughts that you would be better off dead, or of hurting yourself in some way: 0 PHQ-9 Total Score: 0 If you checked off any problems, how difficult have these problems made it for you to do your work, take care of things at home, or get along with other people?: Not difficult at all  Depression Treatment Depression Interventions/Treatment : Counseling     Goals Addressed               This Visit's Progress     Patient Stated (pt-stated)        Would love to be healthy again /2026             Objective:    Today's Vitals   02/05/24 1153  Weight: 211 lb (95.7 kg)  Height: 5' 5 (1.651 m)   Body mass index is 35.11 kg/m.  Hearing/Vision screen Hearing Screening - Comments:: Denies hearing difficulties   Vision Screening - Comments:: Wears eyeglasses/Dr. Porsilio/UTD Immunizations and Health Maintenance Health Maintenance  Topic Date Due   FOOT EXAM  Never done   HIV Screening  Never done   Hepatitis B  Vaccines 19-59 Average Risk (1 of 3 - 19+ 3-dose series) Never done   Colonoscopy  Never done   Cervical Cancer Screening (HPV/Pap Cotest)  08/01/2017   Pneumococcal Vaccine: 50+ Years (3 of 3 - PCV20 or PCV21) 01/20/2022   COVID-19 Vaccine (6 - 2025-26 season) 09/15/2023   Diabetic kidney evaluation - Urine ACR  03/24/2024   Diabetic kidney evaluation - eGFR measurement  03/31/2024   HEMOGLOBIN A1C  07/01/2024   OPHTHALMOLOGY EXAM  12/09/2024   Medicare Annual Wellness (AWV)  02/04/2025   Mammogram  12/01/2025   DTaP/Tdap/Td (4 - Td or Tdap) 06/12/2030   Influenza Vaccine  Completed   HPV VACCINES (No Doses Required) Completed   Hepatitis  C Screening  Completed   Zoster Vaccines- Shingrix  Completed   Meningococcal B Vaccine  Aged Out        Assessment/Plan:  This is a routine wellness examination for Linda Hebert.  Patient Care Team: Wendee Lynwood HERO, NP as PCP - General (Nurse Practitioner) [place], [facility], MD as Attending Physician (Radiology) Dodson Delon FERNS, MD as Referring Physician (Physical Medicine and Rehabilitation) Kristie Lamprey, MD as Consulting Physician (Gastroenterology) Ladona Heinz, MD as Consulting Physician (Cardiology) Tommas Pears, MD as Referring Physician (Endocrinology)  I have personally reviewed and noted the following in the patients chart:   Medical and social history Use of alcohol, tobacco or illicit drugs  Current medications and supplements including opioid prescriptions. Functional ability and status Nutritional status Physical activity Advanced directives List of other physicians Hospitalizations, surgeries, and ER visits in previous 12 months Vitals Screenings to include cognitive, depression, and falls Referrals and appointments  No orders of the defined types were placed in this encounter.  In addition, I have reviewed and discussed with patient certain preventive protocols, quality metrics,  and best practice recommendations. A written personalized care plan for preventive services as well as general preventive health recommendations were provided to patient.   Linda Hebert, CMA   02/05/2024   Return in 1 year (on 02/04/2025).  After Visit Summary: (MyChart) Due to this being a telephonic visit, the after visit summary with patients personalized plan was offered to patient via MyChart   Nurse Notes: Patient is due for a foot exam and can get it done during her next office visit.  Patient is due for a pneumonia vaccine and a Hep B vaccine, which she would like to discuss during her next office visit.  She stated that she has had a recent colonoscopy, however she doesn't know when and where she had it. "

## 2024-02-05 NOTE — Telephone Encounter (Unsigned)
 Copied from CRM #[id number]. Topic: General - Other >> Feb 05, 2024  1:43 PM [name] wrote: Reason for CRM: pt would like to let brendell know her colonoscopy dates..  04/12/20- recent- dr. Renaye sous- does not need another one until 2032.

## 2024-02-11 ENCOUNTER — Ambulatory Visit: Admitting: Family Medicine

## 2024-02-11 VITALS — BP 122/84 | HR 89 | Temp 98.1°F | Ht 65.0 in | Wt 212.2 lb

## 2024-02-11 DIAGNOSIS — R3589 Other polyuria: Secondary | ICD-10-CM | POA: Insufficient documentation

## 2024-02-11 LAB — POC URINALSYSI DIPSTICK (AUTOMATED)
Bilirubin, UA: NEGATIVE
Glucose, UA: NEGATIVE
Ketones, UA: NEGATIVE
Leukocytes, UA: NEGATIVE
Nitrite, UA: NEGATIVE
Protein, UA: NEGATIVE
Spec Grav, UA: 1.02
Urobilinogen, UA: NEGATIVE U/dL — AB
pH, UA: 6

## 2024-02-11 NOTE — Assessment & Plan Note (Signed)
"   Chronic: on going for 2 months.  UA  "

## 2024-02-11 NOTE — Progress Notes (Unsigned)
 "   Patient ID: Linda Hebert, female    DOB: Apr 04, 1970, 54 y.o.   MRN: [id number]  This visit was conducted in person.  BP 122/84   Pulse 89   Temp 98.1 F (36.7 C) (Oral)   Ht 5' 5 (1.651 m)   Wt 212 lb 3.2 oz (96.3 kg)   SpO2 97%   BMI 35.31 kg/m    CC:  Chief Complaint  Patient presents with   Urinary Frequency    And odor since December.      Subjective:   HPI: Linda Hebert is a 54 y.o. female presenting on 02/11/2024 for Urinary Frequency (And odor since December.  )   Urinary Frequency  This is a new problem. The current episode started more than 1 month ago. The problem occurs every urination. The problem has been gradually worsening. The pain is at a severity of 0/10. There has been no fever. She is Not sexually active. There is No history of pyelonephritis. Associated symptoms include frequency, hesitancy and urgency. Pertinent negatives include no chills, discharge, flank pain, hematuria, nausea, possible pregnancy, sweats or vomiting. Associated symptoms comments:  Urinary odor.. strong  Occ may be slightly pinkish urine  Home urinary test showed LE.SABRA She has tried increased fluids for the symptoms. The treatment provided no relief. There is no history of catheterization, kidney stones, recurrent UTIs, a single kidney, urinary stasis or a urological procedure.     Saw PCP Adina Crandall on 12/25/2023      No new medications/supplements  No new foods.  Relevant past medical, surgical, family and social history reviewed and updated as indicated. Interim medical history since our last visit reviewed. Allergies and medications reviewed and updated. Outpatient Medications Prior to Visit  Medication Sig Dispense Refill   amLODipine  (NORVASC ) 2.5 MG tablet Take 1 tablet (2.5 mg total) by mouth daily. 90 tablet 2   calcium  carbonate (OSCAL) 1500 (600 Ca) MG TABS tablet Take 600 mg of elemental calcium  daily with breakfast by mouth.     celecoxib (CELEBREX)  200 MG capsule Take by mouth.     Cholecalciferol (VITAMIN D-1000 MAX ST) 25 MCG (1000 UT) tablet Take 1 tablet by mouth daily.     donepezil  (ARICEPT ) 10 MG tablet Take 1 tablet (10 mg total) by mouth at bedtime. 90 tablet 1   fluticasone  (FLONASE ) 50 MCG/ACT nasal spray Place 2 sprays into both nostrils daily. 16 g 0   gabapentin (NEURONTIN) 100 MG capsule Take 100 mg by mouth at bedtime.     levocetirizine (XYZAL ) 5 MG tablet TAKE 1 TABLET BY MOUTH ONCE DAILY IN THE EVENING 90 tablet 1   levothyroxine  (SYNTHROID , LEVOTHROID) 25 MCG tablet Take 25 mcg by mouth daily.     LORazepam  (ATIVAN ) 0.5 MG tablet Take 0.5 mg by mouth every 6 (six) hours as needed.     MAGNESIUM-ZINC PO Take 250 mg by mouth once.     metoprolol  succinate (TOPROL -XL) 100 MG 24 hr tablet Take 100 mg by mouth daily.     montelukast (SINGULAIR) 10 MG tablet Take 10 mg at bedtime by mouth.  3   Multiple Vitamins-Minerals (ADEKS) chewable tablet Chew 1 tablet by mouth daily.      omeprazole (PRILOSEC) 20 MG capsule Take 20 mg by mouth daily before breakfast.     Probiotic Product (PROBIOTIC-10 PO) Take by mouth.     rosuvastatin  (CRESTOR ) 10 MG tablet Take 1 tablet (10 mg total) by mouth daily. 90 tablet  2   Semaglutide , 2 MG/DOSE, 8 MG/3ML SOPN Inject 2 mg as directed once a week. 9 mL 1   SUMAtriptan  (IMITREX ) 100 MG tablet Take 100 mg by mouth every 2 (two) hours as needed.     valACYclovir  (VALTREX ) 500 MG tablet Take 1 tablet (500 mg total) by mouth daily. 90 tablet 3   vitamin C (ASCORBIC ACID) 500 MG tablet Take 500 mg by mouth daily.     vitamin E 400 UNIT capsule Take 400 Units by mouth daily.     Zoledronic  Acid (RECLAST  IV) Inject into the vein. Once yearly     zonisamide  (ZONEGRAN ) 100 MG capsule Takes 2 capsules at bedtime     No facility-administered medications prior to visit.     Per HPI unless specifically indicated in ROS section below Review of Systems  Constitutional:  Negative  for chills.  Gastrointestinal:  Negative for nausea and vomiting.  Genitourinary:  Positive for frequency, hesitancy and urgency. Negative for flank pain and hematuria.   Objective:  BP 122/84   Pulse 89   Temp 98.1 F (36.7 C) (Oral)   Ht 5' 5 (1.651 m)   Wt 212 lb 3.2 oz (96.3 kg)   SpO2 97%   BMI 35.31 kg/m   Wt Readings from Last 3 Encounters:  02/11/24 212 lb 3.2 oz (96.3 kg)  02/05/24 211 lb (95.7 kg)  01/01/24 211 lb 12.8 oz (96.1 kg)      Physical Exam    Results for orders placed or performed in visit on 01/01/24  POCT glycosylated hemoglobin (Hb A1C)   Collection Time: 01/01/24  2:28 PM  Result Value Ref Range   Hemoglobin A1C 5.9 (A) 4.0 - 5.6 %   HbA1c POC (<> result, manual entry)     HbA1c, POC (prediabetic range)     HbA1c, POC (controlled diabetic range)      Assessment and Plan  Polyuria    No follow-ups on file.   Greig Ring, MD  "

## 2024-02-12 ENCOUNTER — Ambulatory Visit: Payer: Self-pay | Admitting: Family Medicine

## 2024-02-12 ENCOUNTER — Telehealth: Payer: Self-pay

## 2024-02-12 LAB — CBC WITH DIFFERENTIAL/PLATELET
Basophils Absolute: 0.1 10*3/uL (ref 0.0–0.1)
Basophils Relative: 1.3 % (ref 0.0–3.0)
Eosinophils Absolute: 0.2 10*3/uL (ref 0.0–0.7)
Eosinophils Relative: 2.6 % (ref 0.0–5.0)
HCT: 40.4 % (ref 36.0–46.0)
Hemoglobin: 13.6 g/dL (ref 12.0–15.0)
Lymphocytes Relative: 38 % (ref 12.0–46.0)
Lymphs Abs: 2.8 10*3/uL (ref 0.7–4.0)
MCHC: 33.6 g/dL (ref 30.0–36.0)
MCV: 90.1 fl (ref 78.0–100.0)
Monocytes Absolute: 0.5 10*3/uL (ref 0.1–1.0)
Monocytes Relative: 6.8 % (ref 3.0–12.0)
Neutro Abs: 3.8 10*3/uL (ref 1.4–7.7)
Neutrophils Relative %: 51.3 % (ref 43.0–77.0)
Platelets: 296 10*3/uL (ref 150.0–400.0)
RBC: 4.48 Mil/uL (ref 3.87–5.11)
RDW: 14.2 % (ref 11.5–15.5)
WBC: 7.4 10*3/uL (ref 4.0–10.5)

## 2024-02-12 LAB — COMPREHENSIVE METABOLIC PANEL WITH GFR
ALT: 14 U/L (ref 3–35)
AST: 18 U/L (ref 5–37)
Albumin: 4.7 g/dL (ref 3.5–5.2)
Alkaline Phosphatase: 70 U/L (ref 39–117)
BUN: 14 mg/dL (ref 6–23)
CO2: 27 meq/L (ref 19–32)
Calcium: 9.9 mg/dL (ref 8.4–10.5)
Chloride: 106 meq/L (ref 96–112)
Creatinine, Ser: 0.87 mg/dL (ref 0.40–1.20)
GFR: 76.75 mL/min
Glucose, Bld: 94 mg/dL (ref 70–99)
Potassium: 4.5 meq/L (ref 3.5–5.1)
Sodium: 141 meq/L (ref 135–145)
Total Bilirubin: 0.3 mg/dL (ref 0.2–1.2)
Total Protein: 7.6 g/dL (ref 6.0–8.3)

## 2024-02-12 LAB — HEMOGLOBIN A1C: Hgb A1c MFr Bld: 6.3 % (ref 4.6–6.5)

## 2024-02-12 NOTE — Telephone Encounter (Signed)
 Copied from CRM #[id number]. Topic: Clinical - Lab/Test Results >> Feb 12, 2024  9:06 AM Emylou G wrote: Reason for CRM: Patient called.. concerned what she sees on lab results.SABRA looking for call back

## 2024-02-12 NOTE — Telephone Encounter (Signed)
 Patient seen by Dr. Avelina 1/28. Don't see where she has reviewed results yet. I have reached out to patient to let her know once reviewed by provider we will reach out with recommendations.

## 2024-02-15 ENCOUNTER — Encounter: Payer: Self-pay | Admitting: Family Medicine

## 2024-02-15 LAB — URINE CULTURE
MICRO NUMBER:: 17521547
SPECIMEN QUALITY:: ADEQUATE

## 2024-02-17 MED ORDER — NITROFURANTOIN MONOHYD MACRO 100 MG PO CAPS: 100.0000 mg | ORAL_CAPSULE | Freq: Two times a day (BID) | ORAL | 0 refills | Status: AC

## 2024-02-17 NOTE — Telephone Encounter (Signed)
 Copied from CRM [id number]. Topic: Clinical - Lab/Test Results >> Feb 17, 2024  1:03 PM Delon DASEN wrote: Reason for CRM: calling about urine culture results- [phone]

## 2024-04-01 ENCOUNTER — Encounter: Admitting: Nurse Practitioner
